# Patient Record
Sex: Female | Born: 1975 | Race: White | Hispanic: No | Marital: Married | Smoking: Current every day smoker
Health system: Southern US, Community
[De-identification: ages and names within clinical notes are randomized; demographics above are authoritative.]

## PROBLEM LIST (undated history)

## (undated) DIAGNOSIS — R569 Unspecified convulsions: Secondary | ICD-10-CM

---

## 2013-06-12 ENCOUNTER — Inpatient Hospital Stay: Payer: Self-pay | Admitting: Internal Medicine

## 2013-06-12 LAB — COMPREHENSIVE METABOLIC PANEL
Albumin: 4 g/dL (ref 3.4–5.0)
Alkaline Phosphatase: 106 U/L (ref 50–136)
BUN: 9 mg/dL (ref 7–18)
Bilirubin,Total: 0.3 mg/dL (ref 0.2–1.0)
Calcium, Total: 8.5 mg/dL (ref 8.5–10.1)
Chloride: 110 mmol/L — ABNORMAL HIGH (ref 98–107)
Creatinine: 0.83 mg/dL (ref 0.60–1.30)
SGOT(AST): 31 U/L (ref 15–37)
Total Protein: 7.5 g/dL (ref 6.4–8.2)

## 2013-06-12 LAB — DRUG SCREEN, URINE
Barbiturates, Ur Screen: NEGATIVE (ref ?–200)
Benzodiazepine, Ur Scrn: POSITIVE (ref ?–200)
MDMA (Ecstasy)Ur Screen: NEGATIVE (ref ?–500)
Opiate, Ur Screen: NEGATIVE (ref ?–300)
Phencyclidine (PCP) Ur S: NEGATIVE (ref ?–25)
Tricyclic, Ur Screen: NEGATIVE (ref ?–1000)

## 2013-06-12 LAB — CBC
HCT: 44.8 % (ref 35.0–47.0)
HGB: 15.1 g/dL (ref 12.0–16.0)
MCHC: 33.7 g/dL (ref 32.0–36.0)
WBC: 39.6 10*3/uL — ABNORMAL HIGH (ref 3.6–11.0)

## 2013-06-12 LAB — URINALYSIS, COMPLETE
Bacteria: NONE SEEN
Bilirubin,UR: NEGATIVE
Glucose,UR: NEGATIVE mg/dL (ref 0–75)
Leukocyte Esterase: NEGATIVE
Nitrite: NEGATIVE
RBC,UR: 6 /HPF (ref 0–5)
Squamous Epithelial: NONE SEEN

## 2013-06-12 LAB — RAPID INFLUENZA A&B ANTIGENS

## 2013-06-12 LAB — ETHANOL: Ethanol: <3 mg/dL

## 2013-06-12 LAB — PROTIME-INR: Prothrombin Time: 13.5 secs (ref 11.5–14.7)

## 2013-06-12 LAB — TROPONIN I: Troponin-I: <0.02 ng/mL

## 2013-06-12 LAB — CK TOTAL AND CKMB (NOT AT ARMC): CK, Total: 473 U/L — ABNORMAL HIGH (ref 21–215)

## 2013-06-12 LAB — CSF CELL COUNT WITH DIFFERENTIAL
CSF Tube #: 3
WBC (CSF): 6 /mm3

## 2013-06-12 LAB — ACETAMINOPHEN LEVEL: Acetaminophen: <2 ug/mL

## 2013-06-12 LAB — GLUCOSE, CSF: Glucose, CSF: 104 mg/dL — ABNORMAL HIGH (ref 40–75)

## 2013-06-12 LAB — PROTEIN, CSF: Protein, CSF: 47 mg/dL — ABNORMAL HIGH (ref 15–45)

## 2013-06-13 LAB — CBC WITH DIFFERENTIAL/PLATELET
Basophil #: 0 10*3/uL (ref 0.0–0.1)
Eosinophil #: 0 10*3/uL (ref 0.0–0.7)
Eosinophil %: 0 %
HCT: 39.6 % (ref 35.0–47.0)
HGB: 13.5 g/dL (ref 12.0–16.0)
Lymphocyte %: 6.5 %
MCH: 31.8 pg (ref 26.0–34.0)
MCHC: 34.2 g/dL (ref 32.0–36.0)
MCV: 93 fL (ref 80–100)
Monocyte #: 0.5 x10 3/mm (ref 0.2–0.9)
Neutrophil #: 19 10*3/uL — ABNORMAL HIGH (ref 1.4–6.5)
Neutrophil %: 91.1 %
Platelet: 267 10*3/uL (ref 150–440)
RBC: 4.25 10*6/uL (ref 3.80–5.20)
RDW: 13.7 % (ref 11.5–14.5)
WBC: 20.8 10*3/uL — ABNORMAL HIGH (ref 3.6–11.0)

## 2013-06-13 LAB — BASIC METABOLIC PANEL
Anion Gap: 7 (ref 7–16)
BUN: 9 mg/dL (ref 7–18)
Calcium, Total: 8.3 mg/dL — ABNORMAL LOW (ref 8.5–10.1)
Chloride: 112 mmol/L — ABNORMAL HIGH (ref 98–107)
Creatinine: 0.7 mg/dL (ref 0.60–1.30)
EGFR (African American): >60
EGFR (Non-African Amer.): >60
Glucose: 137 mg/dL — ABNORMAL HIGH (ref 65–99)
Sodium: 142 mmol/L (ref 136–145)

## 2013-06-13 LAB — MAGNESIUM: Magnesium: 1.8 mg/dL

## 2013-06-13 LAB — TSH: Thyroid Stimulating Horm: 0.55 u[IU]/mL

## 2013-06-13 LAB — PHOSPHORUS: Phosphorus: 3.7 mg/dL (ref 2.5–4.9)

## 2013-06-14 ENCOUNTER — Ambulatory Visit: Payer: Self-pay | Admitting: Neurology

## 2013-06-14 LAB — URINE CULTURE

## 2013-06-14 LAB — WBC: WBC: 19.5 10*3/uL — ABNORMAL HIGH (ref 3.6–11.0)

## 2013-06-14 LAB — CK: CK, Total: 791 U/L — ABNORMAL HIGH (ref 21–215)

## 2013-06-14 LAB — HCG, QUANTITATIVE, PREGNANCY: Beta Hcg, Quant.: <1 m[IU]/mL — ABNORMAL LOW

## 2013-06-15 LAB — COMPREHENSIVE METABOLIC PANEL
Albumin: 2.9 g/dL — ABNORMAL LOW (ref 3.4–5.0)
Alkaline Phosphatase: 56 U/L (ref 50–136)
Bilirubin,Total: 0.4 mg/dL (ref 0.2–1.0)
Calcium, Total: 8 mg/dL — ABNORMAL LOW (ref 8.5–10.1)
Chloride: 112 mmol/L — ABNORMAL HIGH (ref 98–107)
Creatinine: 0.67 mg/dL (ref 0.60–1.30)
Glucose: 97 mg/dL (ref 65–99)
Potassium: 3.2 mmol/L — ABNORMAL LOW (ref 3.5–5.1)
SGOT(AST): 63 U/L — ABNORMAL HIGH (ref 15–37)
SGPT (ALT): 26 U/L (ref 12–78)
Total Protein: 5.5 g/dL — ABNORMAL LOW (ref 6.4–8.2)

## 2013-06-15 LAB — CBC WITH DIFFERENTIAL/PLATELET
Basophil #: 0 10*3/uL (ref 0.0–0.1)
Basophil %: 0.1 %
Eosinophil #: 0 10*3/uL (ref 0.0–0.7)
Eosinophil %: 0.3 %
HCT: 37.1 % (ref 35.0–47.0)
HGB: 12.7 g/dL (ref 12.0–16.0)
Lymphocyte #: 4 10*3/uL — ABNORMAL HIGH (ref 1.0–3.6)
MCH: 31.9 pg (ref 26.0–34.0)
MCV: 93 fL (ref 80–100)
Monocyte #: 1 x10 3/mm — ABNORMAL HIGH (ref 0.2–0.9)
Monocyte %: 7 %
Neutrophil #: 9.4 10*3/uL — ABNORMAL HIGH (ref 1.4–6.5)
Platelet: 217 10*3/uL (ref 150–440)
RBC: 3.98 10*6/uL (ref 3.80–5.20)
RDW: 13.8 % (ref 11.5–14.5)
WBC: 14.4 10*3/uL — ABNORMAL HIGH (ref 3.6–11.0)

## 2013-06-15 LAB — VANCOMYCIN, TROUGH: Vancomycin, Trough: 14 ug/mL (ref 10–20)

## 2013-06-15 LAB — CSF CULTURE W GRAM STAIN

## 2013-06-17 LAB — CULTURE, BLOOD (SINGLE)

## 2014-01-12 ENCOUNTER — Inpatient Hospital Stay: Payer: Self-pay | Admitting: Internal Medicine

## 2014-01-12 LAB — URINALYSIS, COMPLETE
Bacteria: NONE SEEN
Bilirubin,UR: NEGATIVE
Glucose,UR: NEGATIVE mg/dL (ref 0–75)
Granular Cast: 5
KETONE: NEGATIVE
Leukocyte Esterase: NEGATIVE
Nitrite: NEGATIVE
PH: 5 (ref 4.5–8.0)
Protein: 30
SPECIFIC GRAVITY: 1.011 (ref 1.003–1.030)
Squamous Epithelial: 1
WBC UR: NONE SEEN /HPF (ref 0–5)

## 2014-01-12 LAB — COMPREHENSIVE METABOLIC PANEL
ALT: 17 U/L (ref 12–78)
ANION GAP: 26 — AB (ref 7–16)
AST: 18 U/L (ref 15–37)
Albumin: 4.6 g/dL (ref 3.4–5.0)
Alkaline Phosphatase: 99 U/L
BUN: 12 mg/dL (ref 7–18)
Bilirubin,Total: 0.3 mg/dL (ref 0.2–1.0)
CHLORIDE: 103 mmol/L (ref 98–107)
Calcium, Total: 9.8 mg/dL (ref 8.5–10.1)
Co2: 11 mmol/L — ABNORMAL LOW (ref 21–32)
Creatinine: 1.01 mg/dL (ref 0.60–1.30)
EGFR (African American): >60
EGFR (Non-African Amer.): >60
Glucose: 228 mg/dL — ABNORMAL HIGH (ref 65–99)
Osmolality: 286 (ref 275–301)
Potassium: 3.3 mmol/L — ABNORMAL LOW (ref 3.5–5.1)
Sodium: 140 mmol/L (ref 136–145)
Total Protein: 8.5 g/dL — ABNORMAL HIGH (ref 6.4–8.2)

## 2014-01-12 LAB — DRUG SCREEN, URINE
Amphetamines, Ur Screen: NEGATIVE (ref ?–1000)
BARBITURATES, UR SCREEN: NEGATIVE (ref ?–200)
BENZODIAZEPINE, UR SCRN: NEGATIVE (ref ?–200)
COCAINE METABOLITE, UR ~~LOC~~: NEGATIVE (ref ?–300)
Cannabinoid 50 Ng, Ur ~~LOC~~: NEGATIVE (ref ?–50)
MDMA (ECSTASY) UR SCREEN: NEGATIVE (ref ?–500)
Methadone, Ur Screen: NEGATIVE (ref ?–300)
OPIATE, UR SCREEN: NEGATIVE (ref ?–300)
Phencyclidine (PCP) Ur S: NEGATIVE (ref ?–25)
Tricyclic, Ur Screen: NEGATIVE (ref ?–1000)

## 2014-01-12 LAB — MAGNESIUM: MAGNESIUM: 2.5 mg/dL — AB

## 2014-01-12 LAB — CBC
HCT: 51.1 % — AB (ref 35.0–47.0)
HGB: 16.2 g/dL — ABNORMAL HIGH (ref 12.0–16.0)
MCH: 31.2 pg (ref 26.0–34.0)
MCHC: 31.7 g/dL — ABNORMAL LOW (ref 32.0–36.0)
MCV: 99 fL (ref 80–100)
PLATELETS: 355 10*3/uL (ref 150–440)
RBC: 5.18 10*6/uL (ref 3.80–5.20)
RDW: 15 % — ABNORMAL HIGH (ref 11.5–14.5)
WBC: 23.8 10*3/uL — ABNORMAL HIGH (ref 3.6–11.0)

## 2014-01-12 LAB — PREGNANCY, URINE: Pregnancy Test, Urine: NEGATIVE m[IU]/mL

## 2014-01-13 LAB — BASIC METABOLIC PANEL
ANION GAP: 4 — AB (ref 7–16)
BUN: 11 mg/dL (ref 7–18)
Calcium, Total: 8.4 mg/dL — ABNORMAL LOW (ref 8.5–10.1)
Chloride: 114 mmol/L — ABNORMAL HIGH (ref 98–107)
Co2: 23 mmol/L (ref 21–32)
Creatinine: 0.85 mg/dL (ref 0.60–1.30)
EGFR (African American): >60
EGFR (Non-African Amer.): >60
Glucose: 88 mg/dL (ref 65–99)
Osmolality: 280 (ref 275–301)
Potassium: 4.1 mmol/L (ref 3.5–5.1)
Sodium: 141 mmol/L (ref 136–145)

## 2014-01-13 LAB — CBC WITH DIFFERENTIAL/PLATELET
BASOS ABS: 0.1 10*3/uL (ref 0.0–0.1)
BASOS PCT: 0.5 %
Eosinophil #: 0.1 10*3/uL (ref 0.0–0.7)
Eosinophil %: 0.7 %
HCT: 43.6 % (ref 35.0–47.0)
HGB: 14.6 g/dL (ref 12.0–16.0)
LYMPHS PCT: 15.1 %
Lymphocyte #: 2.5 10*3/uL (ref 1.0–3.6)
MCH: 31.8 pg (ref 26.0–34.0)
MCHC: 33.5 g/dL (ref 32.0–36.0)
MCV: 95 fL (ref 80–100)
Monocyte #: 1.4 x10 3/mm — ABNORMAL HIGH (ref 0.2–0.9)
Monocyte %: 8 %
NEUTROS ABS: 12.8 10*3/uL — AB (ref 1.4–6.5)
Neutrophil %: 75.7 %
Platelet: 284 10*3/uL (ref 150–440)
RBC: 4.59 10*6/uL (ref 3.80–5.20)
RDW: 14.5 % (ref 11.5–14.5)
WBC: 16.9 10*3/uL — ABNORMAL HIGH (ref 3.6–11.0)

## 2014-01-13 LAB — MAGNESIUM: Magnesium: 2.1 mg/dL

## 2014-11-05 IMAGING — CT CT HEAD WITHOUT CONTRAST
1 series · 16 of 30 positions shown, 20 images · non-contrast
Comparison: None.

CLINICAL DATA: Seizure activity.

EXAM:
CT HEAD WITHOUT CONTRAST
TECHNIQUE: Contiguous axial images were obtained from the base of the skull
through the vertex without intravenous contrast.

[Series 2: soft tissue · axial · 0.41mm/px · z∈[-250,-116]mm · 16 of 30 slices shown, 20 images]
[im 2/30  brain]
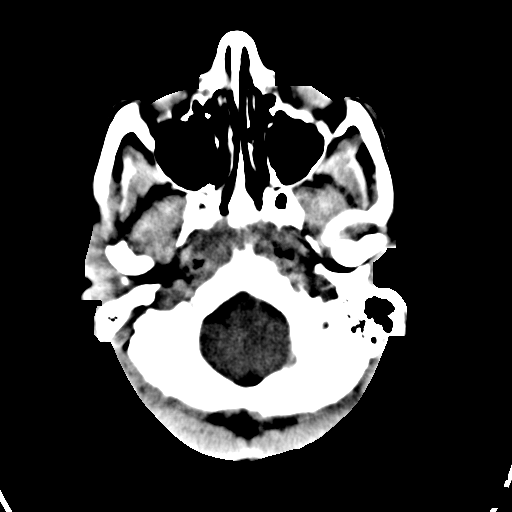
[im 2/30  bone]
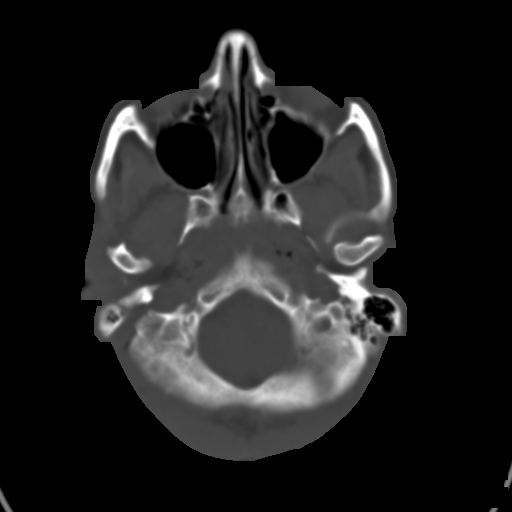
[im 4/30  brain]
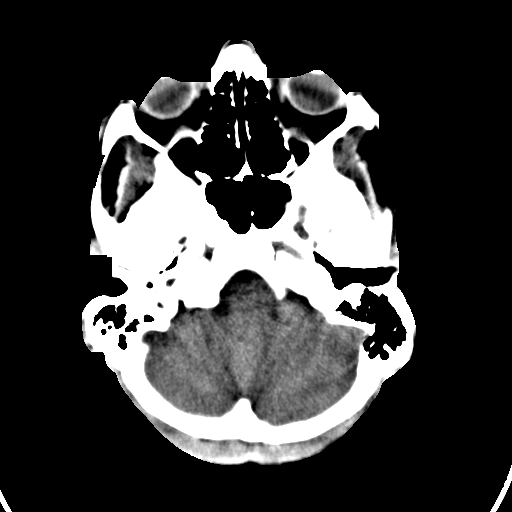
[im 6/30  brain]
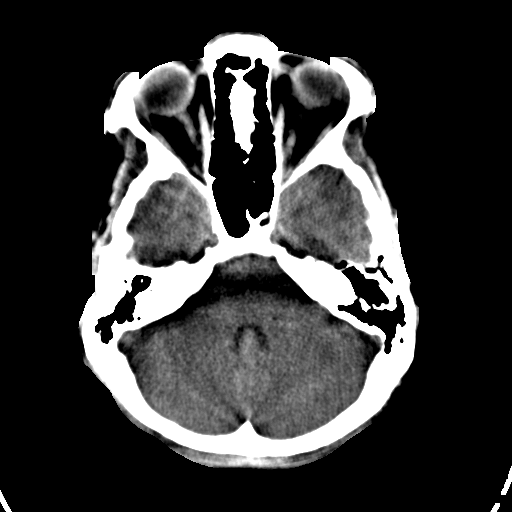
[im 8/30  brain]
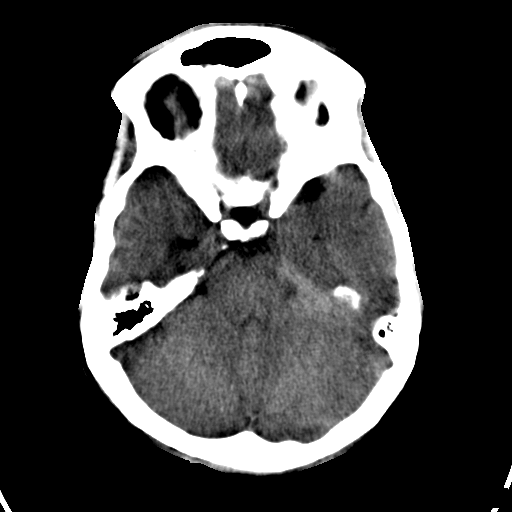
[im 9/30  brain]
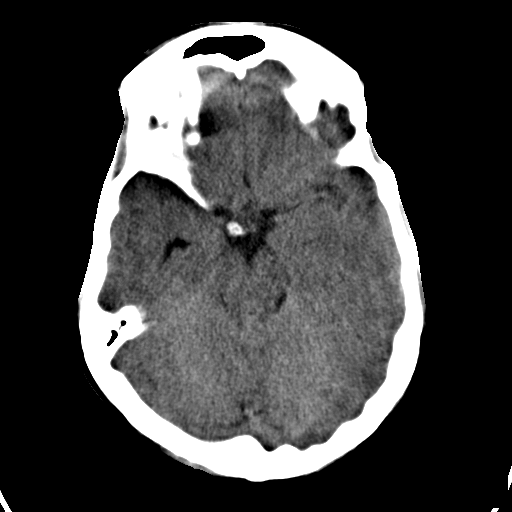
[im 9/30  bone]
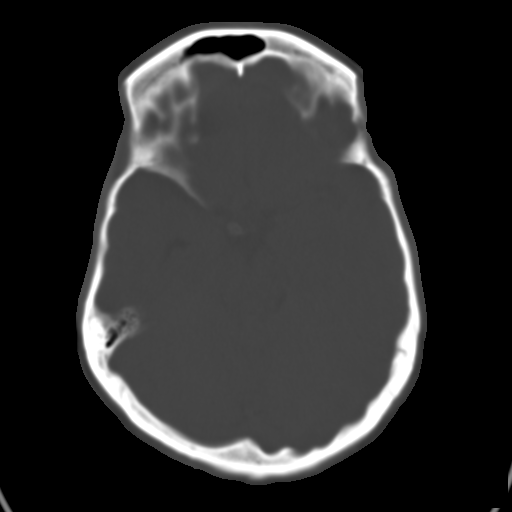
[im 11/30  brain]
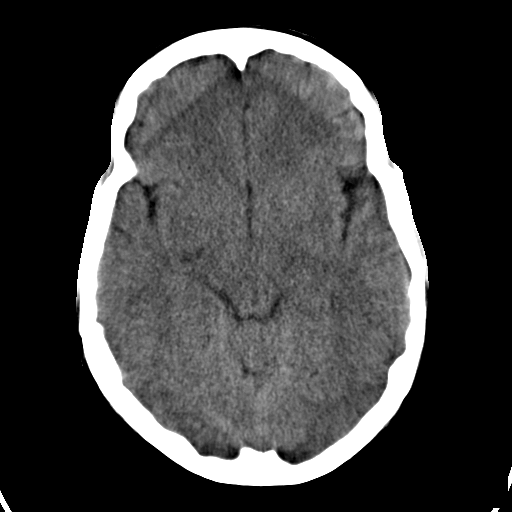
[im 13/30  brain]
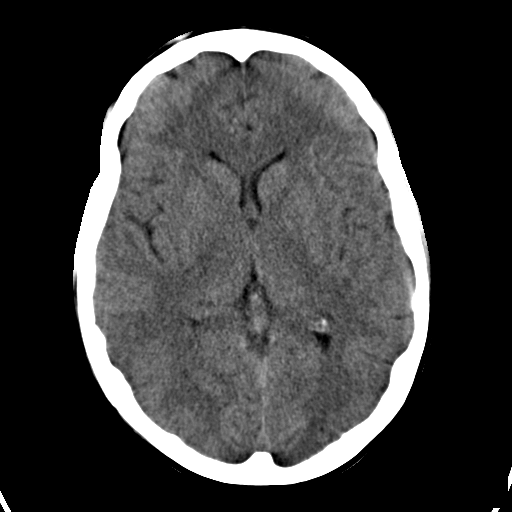
[im 15/30  brain]
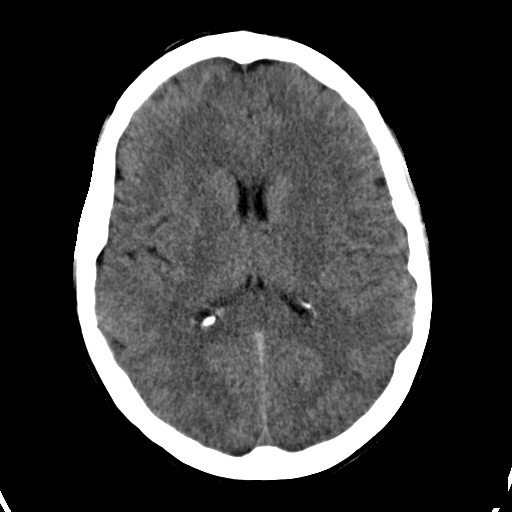
[im 16/30  brain]
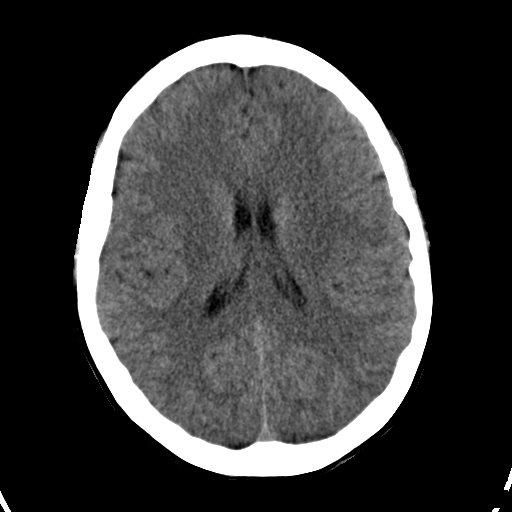
[im 16/30  bone]
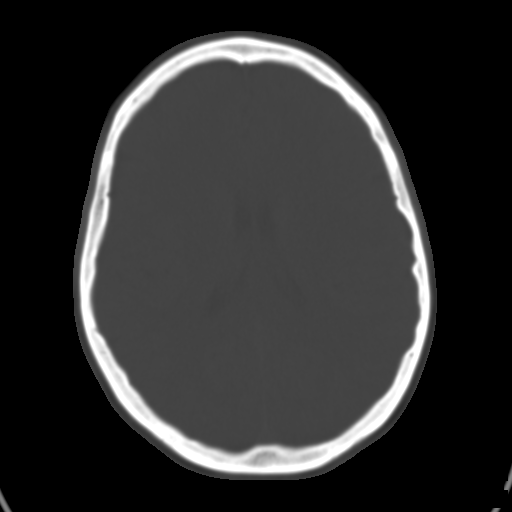
[im 18/30  brain]
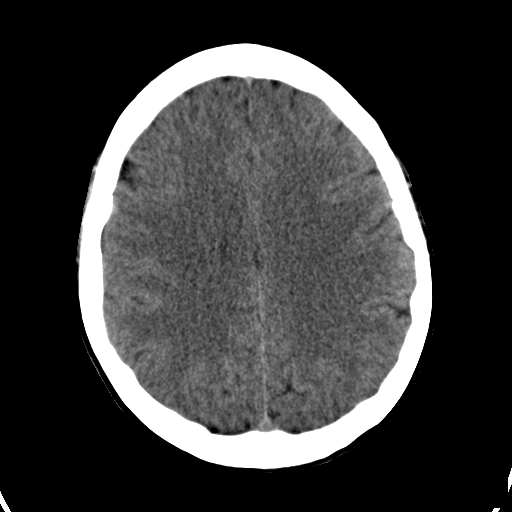
[im 20/30  brain]
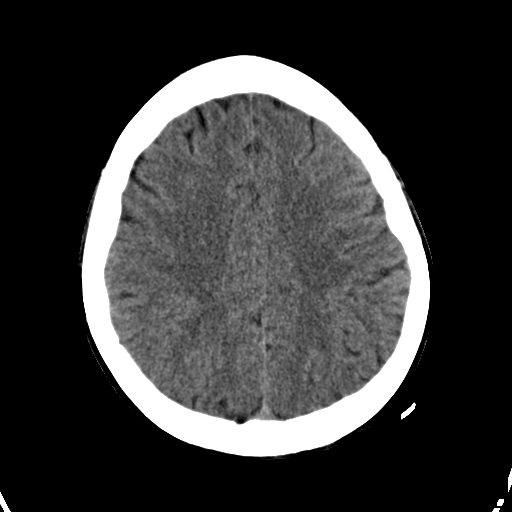
[im 22/30  brain]
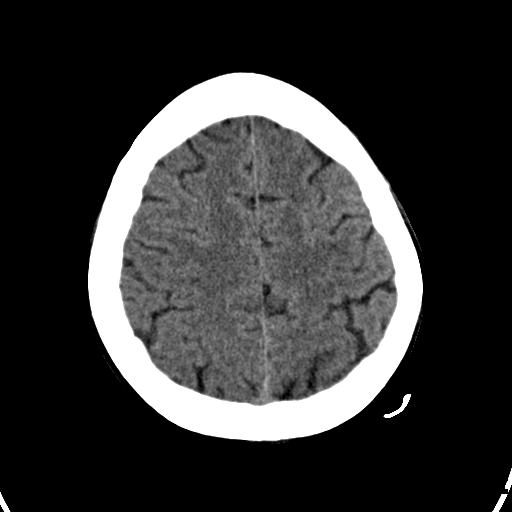
[im 23/30  brain]
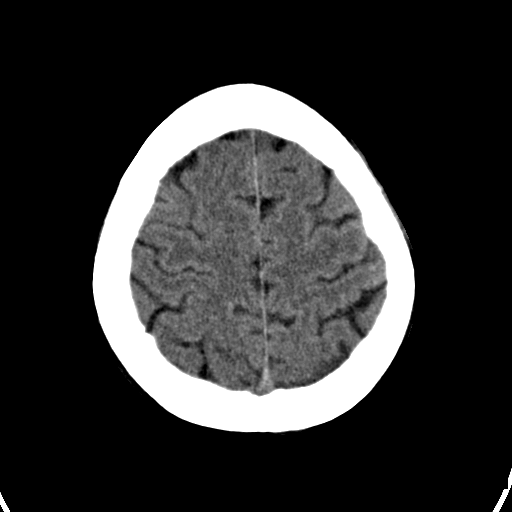
[im 23/30  bone]
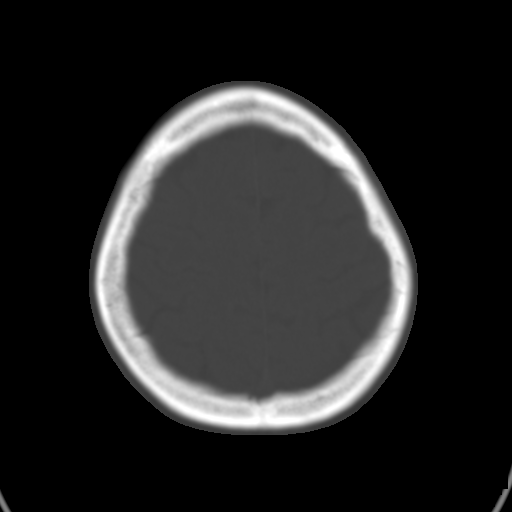
[im 25/30  brain]
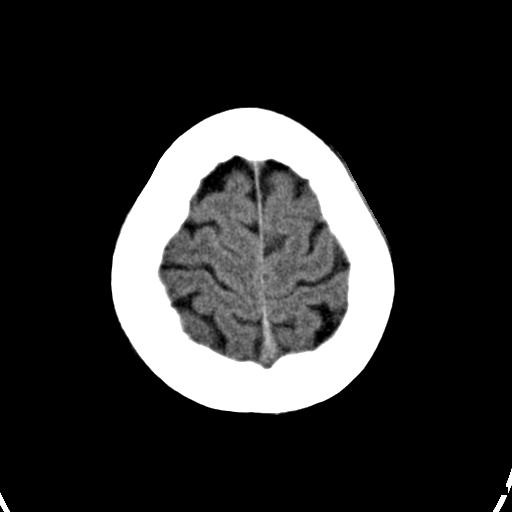
[im 27/30  brain]
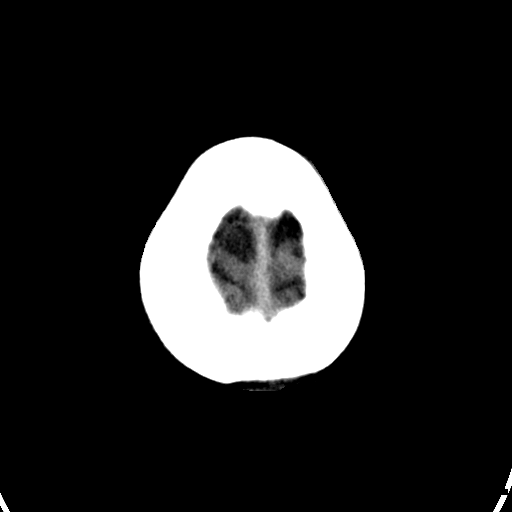
[im 29/30  brain]
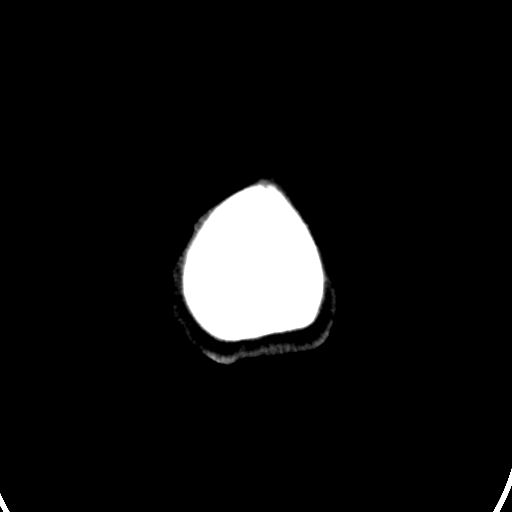

[16 of 30 positions shown; findings below may reference images not displayed]

FINDINGS: The ventricles are normal in size and position. There is no
intracranial hemorrhage nor intracranial mass effect. There are no
abnormal intracranial calcifications. There is no evidence of an
evolving ischemic infarction. The cerebellum and brainstem are
normal in density. At bone window settings the observed portions of
the paranasal sinuses and mastoid air cells are clear.
IMPRESSION: There is no acute intracranial abnormality.

## 2014-11-05 IMAGING — CR DG CHEST 1V PORT
1 series · 1 of 1 positions shown · non-contrast
Comparison: None.

CLINICAL DATA: Intubation.

EXAM:
PORTABLE CHEST - 1 VIEW

[ap]
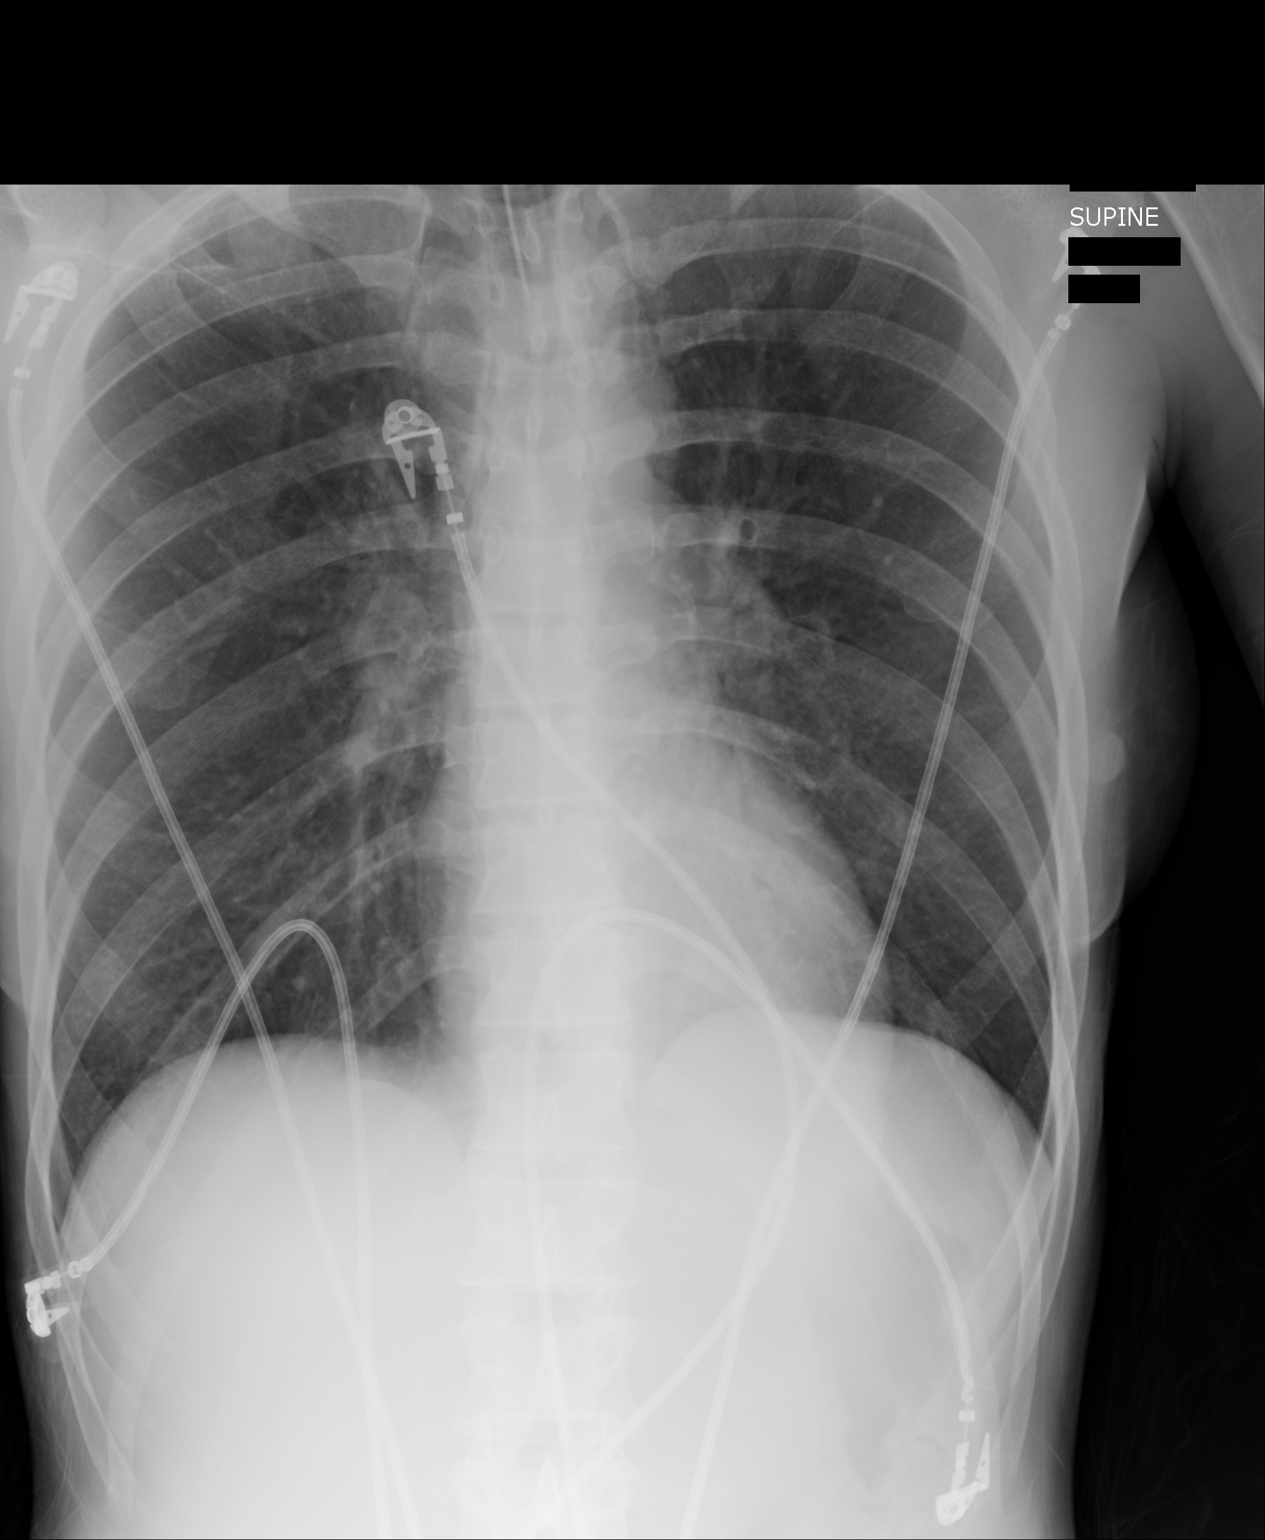

[1 of 1 positions shown; findings below may reference images not displayed]

FINDINGS: Endotracheal tube is 5 cm above the carina. Heart is normal size.
Lungs are clear. No effusions. No acute bony abnormality.
IMPRESSION: Endotracheal tube 5 cm above the carina. No acute cardiopulmonary
disease.

## 2014-11-06 IMAGING — CR DG CHEST 1V PORT
1 series · 2 of 2 positions shown · non-contrast
Comparison: Chest radiograph performed 06/12/2013

CLINICAL DATA: Follow up ventilated patient; respiratory failure.

EXAM:
PORTABLE CHEST - 1 VIEW

[Series 1: ap · 0.17mm/px · 2 of 2 slices shown]
[im 1/2]
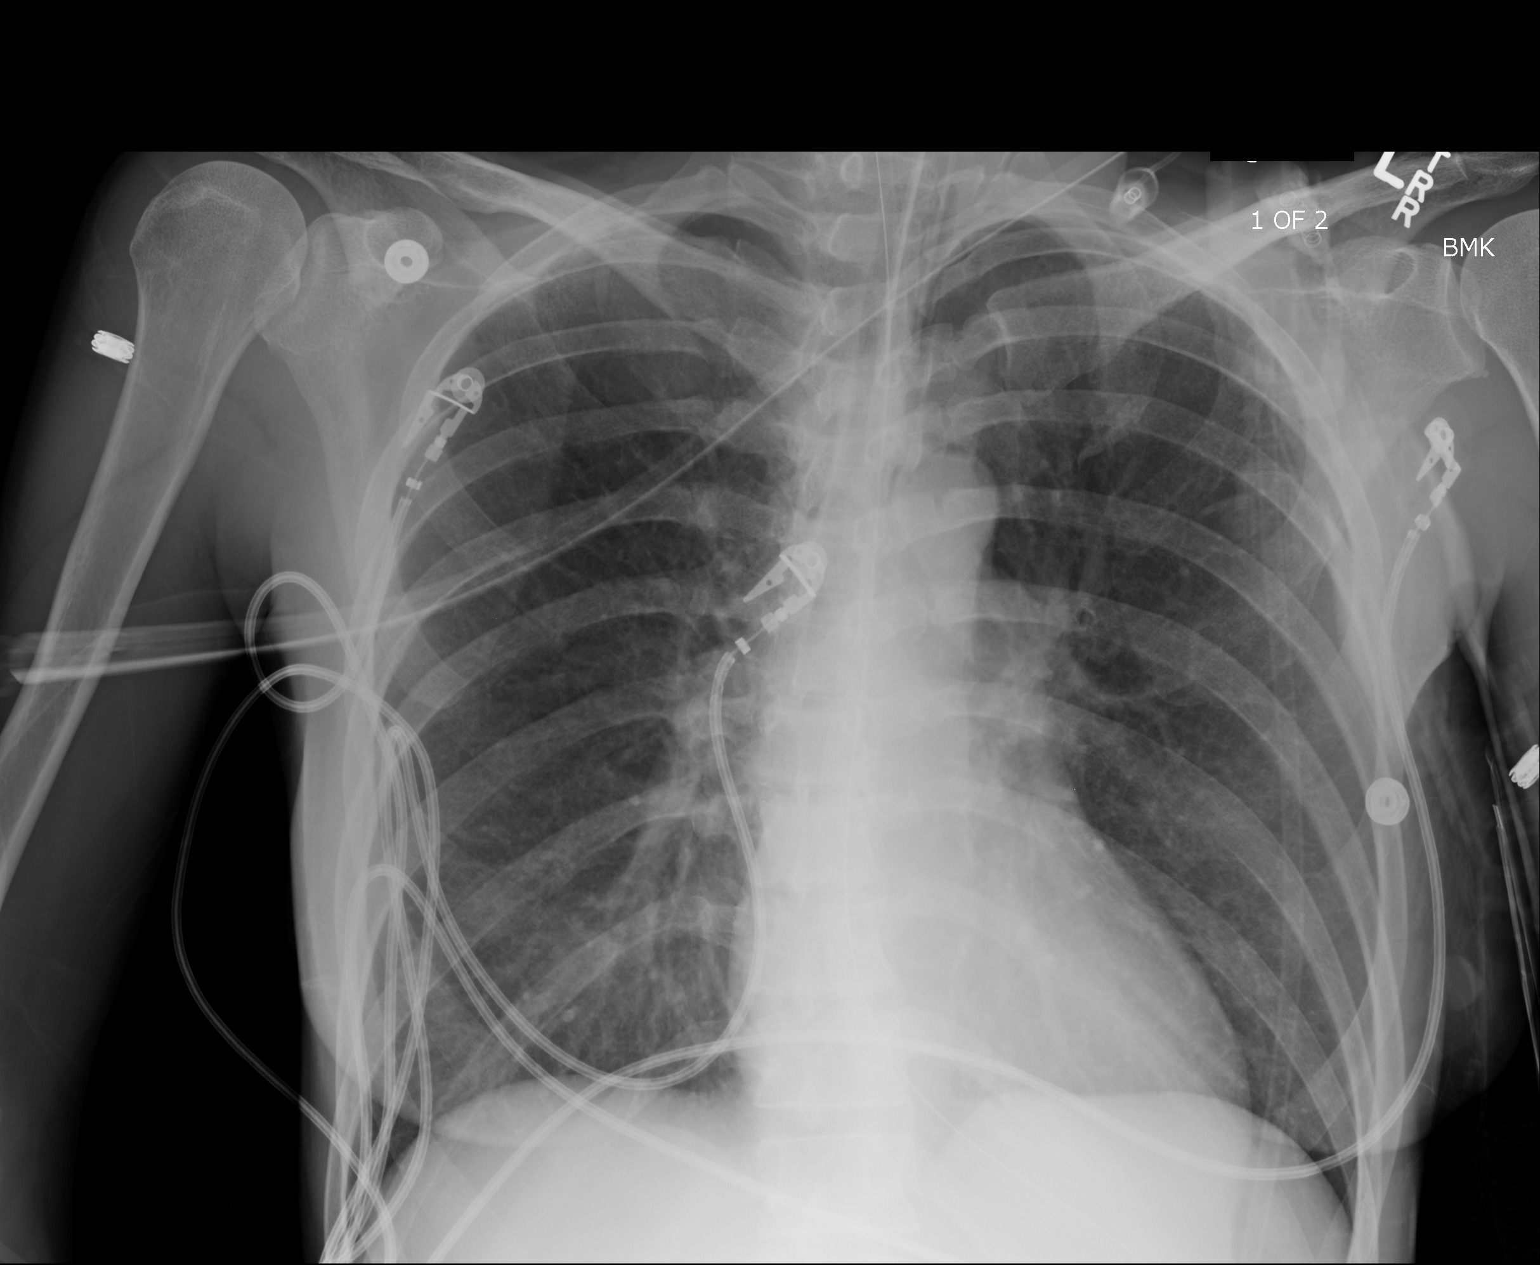
[im 2/2]
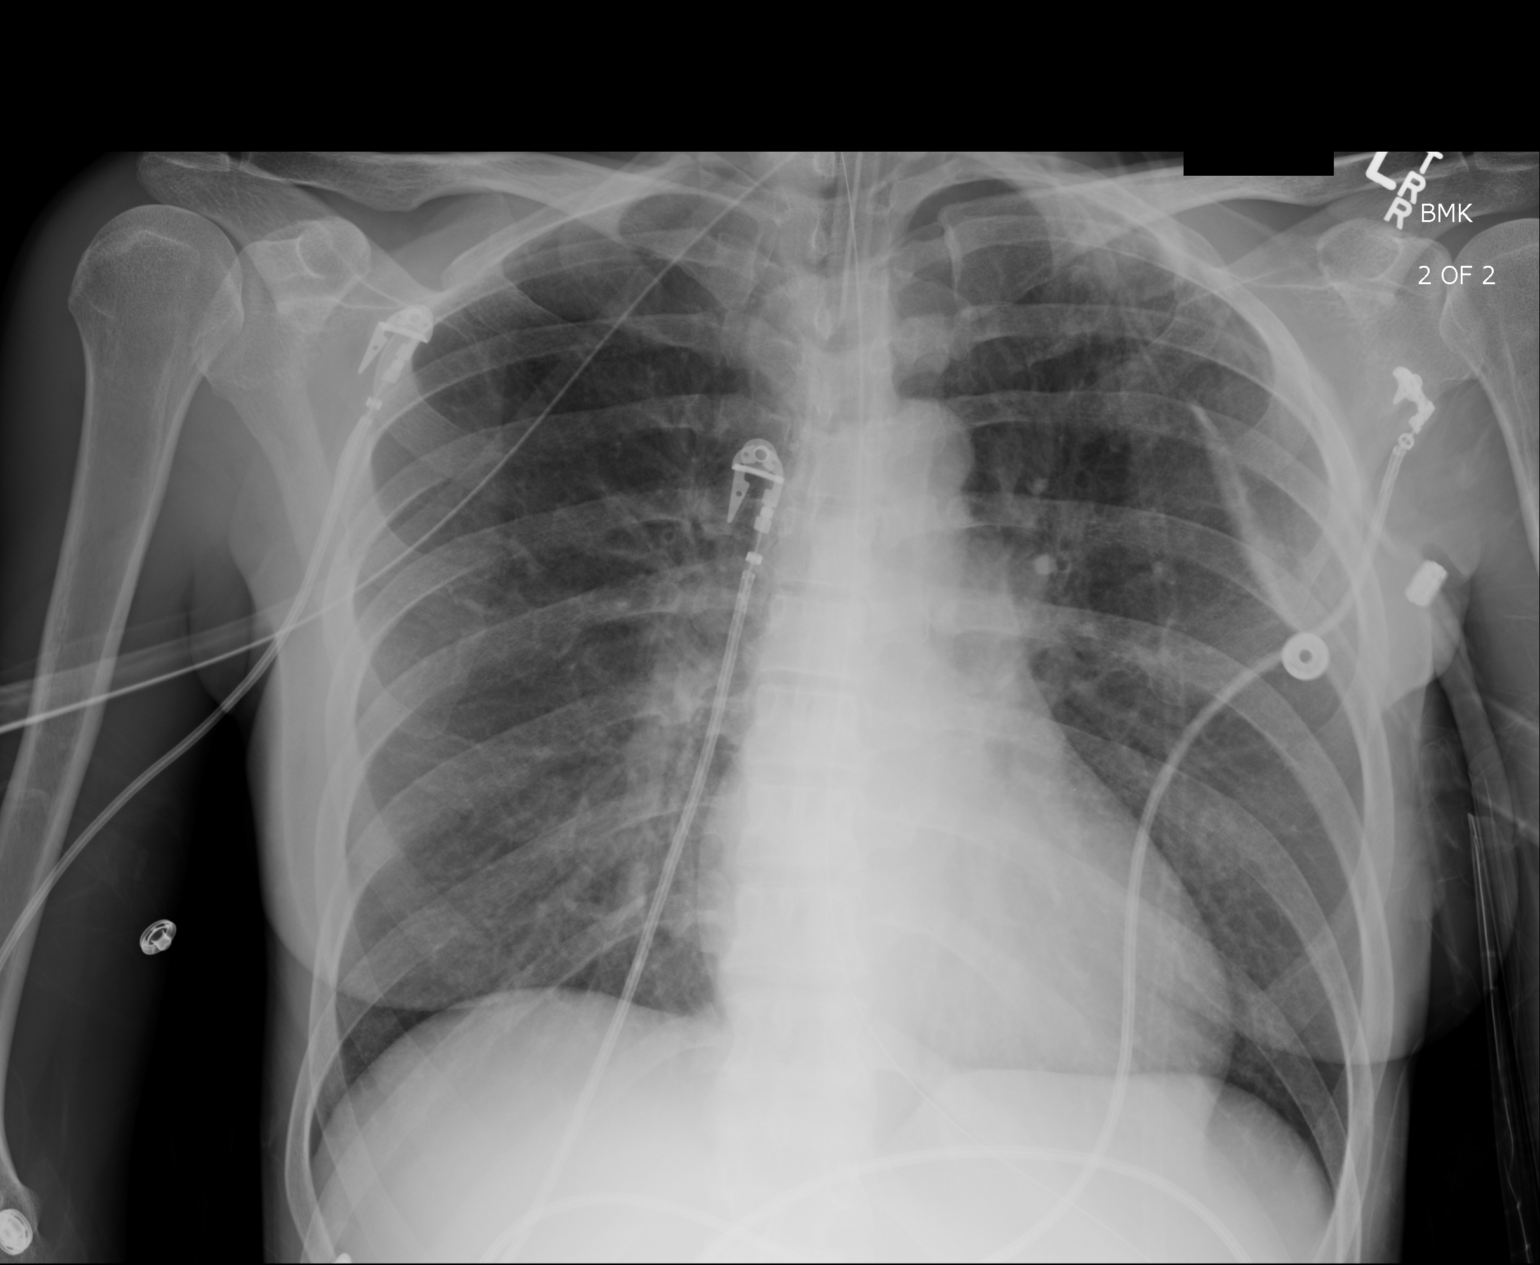

[2 of 2 positions shown; findings below may reference images not displayed]

FINDINGS: The patient's endotracheal tube is seen ending 3 cm above the
carina. The enteric tube is noted extending below the diaphragm.

The lungs are well-aerated and clear. There is no evidence of focal
opacification, pleural effusion or pneumothorax.The
cardiomediastinal silhouette is within normal limits. No acute
osseous abnormalities are seen.
IMPRESSION: 1. Endotracheal tube seen ending 3 cm above the carina.
2. No acute cardiopulmonary process seen.

## 2014-11-20 NOTE — Consult Note (Signed)
Referring Physician:  Alric Seton   Primary Care Physician:  Alric Seton : [facility], [address], [place], [place] [address], [place] [phone]  Reason for Consult: Admit Date: 12-Jun-2013  Chief Complaint: Unresponsive status post seizure  Reason for Consult: seizure   History of Present Illness: History of Present Illness:   39 year old woman with unremarkable past medical history presented with seizures.  Her boyfriend says he got some phone calls that she had not come in to work or called off sick which would be unusual for her because she is normally very prompt and responsible at work.  He went home and saw her making picking movements and very disoriented.  He saw her have some extremity shaking.  He felt it looked like she was having a seizure.  Patient was taken to ED and found to have additional seizures.  She was very confused.  She was found to have some low grade fevers.  The patient occasionally uses marijuana but no hard drug use.  Patient was ultimately started on fosphenytoin and intubated for airway protection.  EEG was performed showing diffuse slowing.  Patient was seen to have fevers of unknown origin thought to have infection, but this has gradually gotten better.  No history of seizures.  No provoking factors identified.  No sleep deprivation.  No change in normal activities.  No family history of seizures, no personal history of trauma or seizures as an infant (febrile seizures).  Given seizures, neurology is asked to consult.   MEDICAL HISTORY: use. SURGICAL HISTORY: HISTORY: smokes about a pack per day. Denies any alcohol or drug use, according to her family.  HISTORY:   Denies.   ROS:  General denies complaints   HEENT no complaints   Lungs no complaints   Cardiac no complaints   GI no complaints   GU no complaints   Musculoskeletal no complaints   Extremities no complaints   Skin no complaints   Endocrine  no complaints   Psych no complaints   Past Medical/Surgical Hx:  Denies:   Home Medications: Medication Instructions Last Modified Date/Time  lacosamide 100 mg oral tablet 1 tab(s) orally 2 times a day 17-Nov-14 17:20   Allergies:  Codeine: Rash, Itching  Vital Signs: **Vital Signs.:   17-Nov-14 13:56  Vital Signs Type Routine  Temperature Temperature (F) 99.5  Celsius 37.5  Temperature Source oral  Pulse Pulse 67  Respirations Respirations 18  Systolic BP Systolic BP 119  Diastolic BP (mmHg) Diastolic BP (mmHg) 75  Mean BP 94  Pulse Ox % Pulse Ox % 96  Pulse Ox Activity Level  At rest  Oxygen Delivery Room Air/ 21 %   EXAM: Patient seems back to normal per her boyfriend.  GENERAL: Pleasant woman.  In NAD.  Normocephalic and atraumatic.  EYES: Funduscopic exam shows normal disc size, appearance and C/D ratio without clear evidence of papilledema.  CARDIOVASCULAR: S1 and S2 sounds are within normal limits, without murmurs, gallops, or rubs.  MUSCULOSKELETAL: Bulk - Normal Tone - Normal Pronator Drift - Absent bilaterally. Ambulation - Gait and station are within normal limits. Romberg - Absent  R/L 5/5    Shoulder abduction (deltoid/supraspinatus, axillary/suprascapular n, C5) 5/5    Elbow flexion (biceps brachii, musculoskeletal n, C5-6) 5/5    Elbow extension (triceps, radial n, C7) 5/5    Finger adduction (interossei, ulnar n, T1)   5/5    Hip flexion (iliopsoas, L1/L2) 5/5  Knee flexion (hamstrings, sciatic n, L5/S1) 5/5    Knee extension (quadriceps, femoral n, L3/4) 5/5    Ankle dorsiflexion (tibialis anterior, deep fibular n, L4/5) 5/5    Ankle plantarflexion (gastroc, tibial n, S1)  NEUROLOGICAL: MENTAL STATUS: Patient is oriented to person, place and time.  Recent and remote memory are intact.  Attention span and concentration are intact.  Naming, repetition, comprehension and expressive speech are within normal limits.  Patient's fund of  knowledge is within normal limits for educational level.  CRANIAL NERVES: Normal    CN II (normal visual acuity and visual fields) Normal    CN III, IV, VI (extraocular muscles are intact) Normal    CN V (facial sensation is intact bilaterally) Normal    CN VII (facial strength is intact bilaterally) Normal    CN VIII (hearing is intact bilaterally) Normal    CN IX/X (palate elevates midline, normal phonation) Normal    CN XI (shoulder shrug strength is normal and symmetric) Normal    CN XII (tongue protrudes midline)   SENSATION: Intact to pain and temp bilaterally (spinothalamic tracts) Intact to position and vibration bilaterally (dorsal columns)   REFLEXES: R/L 2+/2+    Biceps 2+/2+    Brachioradialis   2+/2+    Patellar 2+/2+    Achilles   COORDINATION/CEREBELLAR: Finger to nose testing is within normal limits..  Lab Results:  Thyroid:  14-Nov-14 05:18   Thyroid Stimulating Hormone 0.55 (0.45-4.50 (International Unit)  ----------------------- Pregnant patients have  different reference  ranges for TSH:  - - - - - - - - - -  Pregnant, first trimetser:  0.36 - 2.50 uIU/mL)  Hepatic:  16-Nov-14 04:18   Bilirubin, Total 0.4  Alkaline Phosphatase 56  SGPT (ALT) 26  SGOT (AST)  63  Total Protein, Serum  5.5  Albumin, Serum  2.9  TDMs:  16-Nov-14 21:33   Vancomycin, Trough LAB 14 (Result(s) reported on 15 Jun 2013 at 10:00PM.)  Routine Micro:  13-Nov-14 12:37   Specimen Source INDWELLING CATHETER    15:07   Culture Comment NO GROWTH AEROBICALLY IN 3 DAYS  Gram Stain 1 NO ORGANISMS SEEN NO WBC'S SEEN WHITE BLOOD CELLS  Result(s) reported on 15 Jun 2013 at 09:12AM.    17:18   Micro Text Report INFLUENZA A+B ANTIGENS   COMMENT                   NEGATIVE FOR INFLUENZA A (ANTIGEN ABSENT)   COMMENT                   NEGATIVE FOR INFLUENZA B (ANTIGEN ABSENT)   ANTIBIOTIC                       Lab:  13-Nov-14 12:35   pH (ABG)  7.17  PCO2  51  PO2  102  FiO2  100  Base Excess  -9.9  HCO3  18.3  O2 Saturation 98.0  O2 Device ambu  Specimen Site (ABG) RT RADIAL  Specimen Type (ABG) ARTERIAL  Patient Temp (ABG) 38.2    18:40   pH (ABG)  7.33  PCO2 36  PO2  167  FiO2 40  Base Excess  -6.0  HCO3  18.8  Specimen Site (ABG) RT BRACHIAL  Specimen Type (ABG) ARTERIAL  Patient Temp (ABG) 38.3  Mode ASSIST CONTROL  PEEP 5.0  Lactic Acid, Cardiopulmonary 1.00 (Result(s) reported on 12 Jun 2013 at 06:55PM.)  Vt 500  Mechanical Rate 16 (Result(s) reported on 12 Jun 2013 at 06:55PM.)  14-Nov-14 04:45   pH (ABG) 7.40  PCO2  34  PO2  183  FiO2 40  Base Excess -3.0  HCO3  21.1  O2 Saturation 98.5  O2 Device PB840  Specimen Site (ABG) RT RADIAL  Specimen Type (ABG) ARTERIAL  Patient Temp (ABG) 37.0  Mode ASSIST CONTROL  PEEP 5.0  Lactic Acid, Cardiopulmonary 0.7 (Result(s) reported on 13 Jun 2013 at 04:49AM.)  Vt 500  Mechanical Rate 16 (Result(s) reported on 13 Jun 2013 at 04:49AM.)  15-Nov-14 10:15   pH (ABG)  7.46  PCO2  31  PO2  102  FiO2 30  Base Excess -0.9  HCO3 22.0  O2 Saturation 97.2  O2 Device 840  Specimen Site (ABG) RT RADIAL  Specimen Type (ABG) ARTERIAL  Patient Temp (ABG) 37.0  Mode SPONTANEOUS  PSV 15  PEEP 5.0  %FiO2 30.0 (Result(s) reported on 14 Jun 2013 at 10:29AM.)  Lactic Acid, Cardiopulmonary  1.4 (Result(s) reported on 14 Jun 2013 at 10:29AM.)  General Ref:  13-Nov-14 12:56   HIV Antibodies ========== TEST NAME ==========  ========= RESULTS =========  = REFERENCE RANGE =  HIV 1/2 AB W/CONF WB  Panel [id number] HIV 1/O/2 Abs-Index Value       [   <1.00                ]             <1.00 Index Value: Specimen reactivity relative to the negative cutoff. HIV 1/O/2 Abs, Qual             [   Non Reactive         ]      Non Reactive               [facility]            No: [id number]           [address] [place], [place] [address]           Lindon Romp, MD [phone]   Result(s)  reported on 13 Jun 2013 at 03:34PM.  Acetaminophen, Serum < 2 (10-30 POTENTIALLY TOXIC:  > 200 mcg/mL  > 50 mcg/mL at 12 hr after  ingestion  > 300 mcg/mL at 4 hr after  ingestion)  Salicylates, Serum  5.0 (0.0-2.8 Therapeutic 2.8-20.0 mg/dL Toxic >30.0 mg/dL)    15:07   West Nile Virus, IgM and IgG, CSF ========== TEST NAME ==========  ========= RESULTS =========  = REFERENCE RANGE =  WNV AB, CSF ([facility])  West Nile Virus Antibody, CSF West Nile Virus, IgG            [   Negative             ]          Negative No detectable West Nile Virus IgG Antibody. If a recent infection is suspected, another specimen should be submitted for testing within 7-14 days.               [facility]            No: [id number]           [address], [place], [place] [address]           Josie Dixon, MD         [phone]   Result(s) reported on 16 Jun 2013 at 12:48PM.  VDRL, CSF ==========  TEST NAME ==========  ========= RESULTS =========  = REFERENCE RANGE =  VDRL, CSF  VDRL, CSF VDRL, CSF                       [   Non Reactive         ]      Non Rea:<1:1               [facility]            No: [id number]          [address], [place], [place] [address]           Lindon Romp, MD         [phone]   Result(s) reported on 13 Jun 2013 at 05:54PM.    15:08   HSV 1 and 2 Differentiation by PCR - CSF/Swab/or blood ========== TEST NAME ==========  ========= RESULTS =========  = REFERENCE RANGE =  HSV 1/2 DNA PCR,CSF  HSV 1/2 PCR HSV-1 DNA                       [   Negative             ]          Negative HSV-2 DNA                       [   Negative     ]          Negative This test was developed and its performance characteristics determined by [employer]. It has not been cleared or approved by the U.S. Food and Drug Administration. The FDA has determined that such clearance or approval is not necessary. This test is used for  clinical purposes. It should not be regarded as investigational or research.               [facility]            No: [id number]           [address] [place], [place] [address]   Lindon Romp, MD         [phone]   Result(s) reported on 14 Jun 2013 at 12:48AM.  Cardiology:  13-Nov-14 12:24   Ventricular Rate 147  Atrial Rate 97  P-R Interval 132  QRS Duration 80  QT 316  QTc 494  P Axis 82  R Axis 45  T Axis 63  ECG interpretation Sinus rhythm with frequent Premature ventricular complexes Cannot rule out Anterior infarct , age undetermined Abnormal ECG No previous ECGs available ----------unconfirmed---------- Confirmed by OVERREAD, NOT ([id number]), editor PEARSON, BARBARA ([id number])on 06/13/2013 2:01:58 PM  Routine Chem:  [id number] 12:35   Result Comment - HAND DELIVERED  - NOTIFIED OF CRITICAL VALUE  - to: Sharee Pimple RN 06/12/13 12:48  Result(s) reported on 12 Jun 2013 at 12:50PM.    12:56   Ethanol, S. < 3  Ethanol % (comp) < 0.003 (Result(s) reported on 12 Jun 2013 at 01:37PM.)  14-Nov-14 05:18   Phosphorus, Serum 3.7 (Result(s) reported on 13 Jun 2013 at [facility].)  Magnesium, Serum 1.8 (1.8-2.4 THERAPEUTIC RANGE: 4-7 mg/dL TOXIC: > 10 mg/dL  -----------------------)  15-Nov-14 12:50   HCG Betasubunit Quant. Serum  < 1 (1-3  (International Unit)  ----------------- Non-pregnant <5 Weeks Post LMP mIU/mL  3- 4 wk 9 - 130  4- 5 wk 75 -  2,600  5- 6 wk 850 - 20,800  6- 7 wk 4,000 - 100,000  7-12 wk 11,500 - 289,000 12-16 wk 18,000 - 137,000 16-29 wk 1,400 - 53,000 29-41 wk 940 - 60,000)  16-Nov-14 04:18   Glucose, Serum 97  BUN 16  Creatinine (comp) 0.67  Sodium, Serum 142  Potassium, Serum  3.2  Chloride, Serum  112  CO2, Serum 26  Calcium (Total), Serum  8.0  Osmolality (calc) 284  eGFR (African American) >60  eGFR (Non-African American) >60 (eGFR values <67m/min/1.73 m2 may be an indication of chronic kidney disease (CKD). Calculated eGFR is  useful in patients with stable renal function. The eGFR calculation will not be reliable in acutely ill patients when serum creatinine is changing rapidly. It is not useful in  patients on dialysis. The eGFR calculation may not be applicable to patients at the low and high extremes of body sizes, pregnant women, and vegetarians.)  Anion Gap  4  Urine Drugs:  [id number]  Tricyclic Antidepressant, Ur Qual (comp) NEGATIVE (Result(s) reported on 12 Jun 2013 at 01:49PM.)  Amphetamines, Urine Qual. NEGATIVE  MDMA, Urine Qual. NEGATIVE  Cocaine Metabolite, Urine Qual. NEGATIVE  Opiate, Urine qual NEGATIVE  Phencyclidine, Urine Qual. NEGATIVE  Cannabinoid, Urine Qual. POSITIVE  Barbiturates, Urine Qual. NEGATIVE  Benzodiazepine, Urine Qual. POSITIVE (----------------- The URINE DRUG SCREEN provides only a preliminary, unconfirmed analytical test result and should not be used for non-medical  purposes.  Clinical consideration and professional judgment should be  applied to any positive drug screen result due to possible interfering substances.  A more specific alternate chemical method must be used in order to obtain a confirmed analytical result.  Gas chromatography/mass spectrometry (GC/MS) is the preferred confirmatory method.)  Methadone, Urine Qual. NEGATIVE  Cardiac:  13-Nov-14 12:56   CPK-MB, Serum  6.2 (Result(s) reported on 12 Jun 2013 at 01:37PM.)  Troponin I < 0.02 (0.00-0.05 0.05 ng/mL or less: NEGATIVE  Repeat testing in 3-6 hrs  if clinically indicated. >0.05 ng/mL: POTENTIAL  MYOCARDIAL INJURY. Repeat  testing in 3-6 hrs if  clinically indicated. NOTE: An increase or decrease  of 30% or more on serial  testing suggests a  clinically important change)  15-Nov-14 03:13   CK, Total  791 (Result(s) reported on 14 Jun 2013 at 03:53AM.)  Routine UA:  13-Nov-14 12:37   Color (UA) Straw  Clarity (UA) Turbid  Glucose (UA) Negative  Bilirubin (UA) Negative  Ketones  (UA) Negative  Specific Gravity (UA) 1.014  Blood (UA) 2+  pH (UA) 5.0  Protein (UA) Negative  Nitrite (UA) Negative  Leukocyte Esterase (UA) Negative (Result(s) reported on 12 Jun 2013 at 01:43PM.)  RBC (UA) 6 /HPF  WBC (UA) NONE SEEN  Bacteria (UA) NONE SEEN  Epithelial Cells (UA) NONE SEEN  Mucous (UA) PRESENT  Amorphous Crystal (UA) PRESENT (Result(s) reported on 12 Jun 2013 at 01:43PM.)  CSF Analysis:  13-Nov-14 15:07   CSF Tube # 3  Color - Uncentrifuged (CSF) COLORLESS  Clarity (CSF) CLEAR  Color - Centrifuged (CSF) COLORLESS  RBC  (CSF) 23  WBC  (CSF) 6  Neutrophils  (CSF) 54  Lymphocytes  (CSF) 33  Monocytes/Macrophages  (CSF) 13  Eosinophil  (CSF) 0  Other Cells  (CSF) 0 (Result(s) reported on 12 Jun 2013 at 05:13PM.)  Glucose, CSF  104 (Result(s) reported on 12 Jun 2013 at 04:25PM.)  Protein, CSF  47 (Result(s) reported on 12 Jun 2013 at 04:25PM.)  Routine Coag:  13-Nov-14 12:56   Prothrombin 13.5  INR 1.0 (INR reference interval applies to patients on anticoagulant therapy. A single INR therapeutic range for coumarins is not optimal for all indications; however, the suggested range for most indications is 2.0 - 3.0. Exceptions to the INR Reference Range may include: Prosthetic heart valves, acute myocardial infarction, prevention of myocardial infarction, and combinations of aspirin and anticoagulant. The need for a higher or lower target INR must be assessed individually. Reference: The Pharmacology and Management of the Vitamin K  antagonists: the seventh ACCP Conference on Antithrombotic and Thrombolytic Therapy. AQTMA.2633 Sept:126 (3suppl): N9146842. A HCT value >55% may artifactually increase the PT.  In one study,  the increase was an average of 25%. Reference:  "Effect on Routine and Special Coagulation Testing Values of Citrate Anticoagulant Adjustment in Patients with High HCT Values." American Journal of Clinical Pathology 2006;126:400-405.)   Routine Hem:  16-Nov-14 04:18   WBC (CBC)  14.4  RBC (CBC) 3.98  Hemoglobin (CBC) 12.7  Hematocrit (CBC) 37.1  Platelet Count (CBC) 217  MCV 93  MCH 31.9  MCHC 34.2  RDW 13.8  Neutrophil % 65.0  Lymphocyte % 27.6  Monocyte % 7.0  Eosinophil % 0.3  Basophil % 0.1  Neutrophil #  9.4  Lymphocyte #  4.0  Monocyte #  1.0  Eosinophil # 0.0  Basophil # 0.0 (Result(s) reported on 15 Jun 2013 at 04:30AM.)   Radiology Results: CT:    13-Nov-14 13:57, CT Head Without Contrast  CT Head Without Contrast   REASON FOR EXAM:    seizuing  COMMENTS:       PROCEDURE: CT  - CT HEAD WITHOUT CONTRAST  - Jun 12 2013  1:57PM     CLINICAL DATA:  Seizure activity.    EXAM:  CT HEAD WITHOUT CONTRAST    TECHNIQUE:  Contiguous axial images were obtained from the baseof the skull  through the vertex without intravenous contrast.    COMPARISON:  None.  FINDINGS:  The ventricles are normal in size and position. There is no  intracranial hemorrhage nor intracranial mass effect. There are no  abnormal intracranial calcifications. There is no evidence of an  evolving ischemic infarction. The cerebellum and brainstem are  normal in density. At bone window settings the observed portions of  the paranasal sinuses and mastoid air cells are clear.     IMPRESSION:  There is no acute intracranial abnormality.      Electronically Signed    By: David  Martinique    On: 06/12/2013 13:59     Verified By: DAVID A. Martinique, M.D., MD   Impression/Recommendations: Recommendations:   39 year old woman with unremarkable past medical history presented with seizures.  with unprovoked seizure.  First lifetime seizure, so no indicaiton to start AED since EEG negative for epileptiform activity and Brain MRI is unremarkable.  UTox positive for increased salicylate intake.  Warned patient against this.  Utox positive for MJ and benzos, warned against overuse of benzos and that withdrawal from benzos can sometimes  cause seizures.  CSF studies are unremarkable, viral panels were negative, HIV negative, I do not think that the CSF findings of 6 WBC warrants further workup at this time given her normal exam and normal afebrile temperature.   that over half of unprovoked seizures with negative initial workup never recur.  Discussed risk factors for reducing chances of seizures and steps she can take like avoiding high levels of stress, getting regular sleep (avoiding sleep  deprivation), avoiding any drugs or alcohol, or taking medication that can lower the risk for seizures.  She should always mention to her PCPs her history of seizure before they start her on any medications.  She should followup with me or another outpatient provider in 1-2 weeks to make sure she is doing well.   have personally viewed the patient's Brain MRI and it unremarkable.  I have reviewed the results of the most recent imaging studies, tests and labs as outlined above and answered all related questions.  and coordinated plan of care with hospitalist.   Electronic Signatures: Anabel Bene (MD)  (Signed [phone] 12:22)  Authored: REFERRING PHYSICIAN, Primary Care Physician, Consult, History of Present Illness, Review of Systems, PAST MEDICAL/SURGICAL HISTORY, HOME MEDICATIONS, ALLERGIES, NURSING VITAL SIGNS, Physical Exam-, LAB RESULTS, RADIOLOGY RESULTS, Recommendations   Last Updated: 22-Nov-14 12:22 by Anabel Bene (MD)

## 2014-11-20 NOTE — Consult Note (Signed)
PATIENT NAME:  Valerie Castillo, Valerie Castillo MR#:  [id number] DATE OF BIRTH:  [birth year]  DATE OF CONSULTATION:  06/12/2013  REFERRING PHYSICIAN:  Shreyang H. Allena Katz, MD CONSULTING PHYSICIAN:  Hemang K. Sherryll Burger, MD  REASON FOR CONSULTATION: Encephalitis and status epilepticus.   HISTORY OF PRESENT ILLNESS: The patient is a 39 year old, right-handed Caucasian female who is in usual health until May 11, 2013. Her husband was gone out to [employer], but he had seen her the night before around 10:00, that she was in her usual self.   She might have done marijuana around a week ago or so, but he is not aware of her doing any other drugs.   The patient did not go to her work, so the husband was contacted by the coworkers. He went home, and other people were not getting to the house, as patient was not responding, so he went through the back door.   The patient was found to be staring, unresponsive, picking on things.   Then her husband noticed that she went into a generalized seizure, where the whole body tonic posture, lasting for around 2 minutes or so, with eyes rolled up. He could not tell me whether there was any eye deviation or gaze deviation. She had urinated on herself.   Afterwards, she was sleepy for 2 to 3 minutes and then confused.   The patient was taken to the EMS people after he had called 911, but she had another seizure. The patient might have had another seizure en route, as well as in the ER and the patient was intubated and sedated with propofol, got loaded with fosphenytoin.   The patient was also noted to have a fever and very high WBC count of 39,000, so there was concern for encephalitis or meningitis.   The patient does not have any known rash. No tick bite that husband is aware of.   PAST MEDICAL HISTORY: Essentially negative.   PAST SURGICAL HISTORY: Negative.   ALLERGIES:  SHE DOES NOT HAVE ANY KNOWN DRUG ALLERGIES.   MEDICATIONS: She does not take any medications on a regular  basis.   SOCIAL HISTORY: Significant in that she smokes a pack per day. She does not use drugs or alcohol, per family, but her urine drug screen was positive for marijuana and benzodiazepines.   FAMILY HISTORY: Positive for hypertension.    REVIEW OF SYSTEMS: Unobtainable, as the patient is intubated, sedated.   PHYSICAL EXAMINATION: VITAL SIGNS: Temperature is 101.4, pulse 94, respiratory rate 24, blood pressure 105/55, pulse oximetry is 98%.  GENERAL: She is a young Caucasian female, lying in bed intubated, sedated.   I had talked to the patient's family members outside.   The patient had a nasal swab being taken and she did have a painful squinting of her face and nonpurposeful movements of her arms and legs.   I cannot check her cognitive function. The patient does not seem to have a significantly stiff neck. She has negative Kernig sign and Brudzinski sign.   I did not see any rash on her body.   The nurse might have noticed some rash in the perineal region, which I have not examined.   LUNGS: Clear to auscultation.  HEART: S1, S2 heart sounds. Carotid exam did not reveal any bruit.  ABDOMEN:  Her belly was soft. On her cranial nerves, her pupils were reactive.  NEUROLOGIC:  Extraocular movements were difficult to check, but she did not significant dolls eye movement. Her face was symmetric.  I cannot check her hearing, visual fields, or tongue very well. On her motor exam, she does have changes in the tone. She seemed to be actively resisting some movements. Sometimes she had almost limping of her limbs.   I did not feel that she was having any seizure, as her motor movements were not repetitious, stereotypic.   She does withdraw to painful stimuli in both legs but less responsive with both arms.   LABORATORY, DIAGNOSTIC AND RADIOLOGICAL DATA: Her CT scan of the head was unremarkable.   ASSESSMENT AND PLAN: 1.  Acute onset of frequent seizures, concern for status epilepticus.  Currently, the patient is appropriately intubated and sedated on propofol and has been getting IV fosphenytoin. Agree with getting urgent EEG.  2.  Pharmacy to follow her Dilantin level and keep it high therapeutic range.  3.  If the patient has any breakthrough seizures, we might have to consider adding another antiepileptic medication, such as levetiracetam 1500 mg b.i.d. for two days, then 500 mg p.o. b.i.d.  4.  Concern for encephalitis and meningitis.  5.  I agree with the concern for infectious etiology of her symptoms, due to very high white blood cell count and her being febrile.  6.  CSF results just came back, and she does not have significant elevation of her WBC count or significant elevation of protein, which is very unlikely that she has encephalitis or meningitis.  7.  If okay with primary team, it would be okay for her to get discontinued on her IV antibiotics and antiviral medication. Her droplet precautions can be withdrawn as well.  8.  I had an extensive discussion with her husband, who also noted that the patient's first name is noted spelled incorrectly in the computer, that they will fix.  9.  I will follow this patient with you. Feel free to contact me with any further questions.   The patient is critically ill, and more than 50% of the time was spent in coordination of the care, talking to the family members and nurses. I also talked to Dr. Allena KatzPatel on the phone, Dr. Meredeth IdeFleming in person and with nurses. I spent around 40 minutes for this patient.   ____________________________ Hemang K. Sherryll BurgerShah, MD hks:cg D: 06/12/2013 17:39:01 ET T: 06/12/2013 22:22:55 ET JOB#: [id number]  cc: Hemang K. Sherryll BurgerShah, MD, <Dictator> Durene CalHEMANG K [facility] MD ELECTRONICALLY SIGNED 06/13/2013 12:51

## 2014-11-20 NOTE — Consult Note (Signed)
Brief Consult Note: Diagnosis: status epilepticus, encephalitis/meningitis (?).   Patient was seen by consultant.   Consult note dictated.   Comments: - agree with fosphenytoin load and add levetiracetam if continues to have seizure. agree with urgent EEG (no tech available though) - encephalitis/meningitis (fever, lecocytosis) - CSF came back OK - ok to stop droplet precautions, and stop Abx and antivirals (if OK with hospitalist) - will follow. discussed with family and Dr. Meredeth IdeFleming.  Electronic Signatures: Jolene ProvostShah, Saraiah Bhat Kalpeshkumar (MD)  (Signed [phone]-Nov-14 17:47)  Authored: Brief Consult Note   Last Updated: 13-Nov-14 17:47 by Jolene ProvostShah, Jonet Mathies Kalpeshkumar (MD)

## 2014-11-20 NOTE — H&P (Signed)
PATIENT NAME:  Valerie Castillo, Valerie Castillo MR#:  [id number] DATE OF BIRTH:  Aug 03, 1975  DATE OF ADMISSION:  06/12/2013  PRIMARY CARE PROVIDER: None.   EMERGENCY DEPARTMENT REFERRING PHYSICIAN: Sheryl L. Mindi JunkerGottlieb, MD  CHIEF COMPLAINT: Decrease in responsiveness, status epilepticus, fever.   HISTORY OF PRESENT ILLNESS: The patient is a 39 year old white female with no significant past medical history except smoking, who, according to her boyfriend, was doing okay up until this morning. He went for hunting this morning. At that time, the patient snuggled up to him, but was not fully awake. 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 Therefore, he went home and saw her in her bed, sitting, picking at things, disoriented. Subsequently, she started having tonic-clonic seizure-type activity. Therefore, he called EMS. When EMS got there, the patient was confused, had another seizure activity. They were in the process of bringing her here, and she started becoming conscious and then started becoming agitated and did not want to come here. After being brought to the ED, she continued to have further seizures and received lorazepam. Due to persistent recurrent seizures, she had to be intubated. Her temperature was 100.9, and her WBC count was elevated at 39.6. She currently is on the vent, and I spoke to her significant other. He reports that she had not complained of any symptoms. Only complaint she has had is issue with sinus trouble. She has not had any headaches, has not complained of any neck stiffness and was doing okay. He reports that she does not drink. The only thing she does is smoke. She did use some marijuana last week, but he is not aware of any drug use by the patient.   PAST MEDICAL HISTORY: Essentially is negative.   PAST SURGICAL HISTORY: None.   ALLERGIES: None.   MEDICATIONS: None.   SOCIAL HISTORY: She smokes about a pack per day.  Denies any alcohol or drug use, according to her family.   FAMILY HISTORY: Hypertension.   REVIEW OF SYSTEMS: Unobtainable due to the patient being obtunded and intubated.  PHYSICAL EXAMINATION:  VITAL SIGNS: Temperature 100.9, pulse 101, respirations 20, blood pressure 181/90.  GENERAL: The patient is intubated and currently sedated. Critically ill, thin female, ET tube in place.  HEENT: Head atraumatic, normocephalic. Pupils equally round and reactive to light and accommodation. There is no conjunctival pallor. No scleral icterus. Unable to do extraocular movements. Nasal exam shows no drainage or ulceration externally. External ear exam shows no drainage or erythema. Mouth: She has ET tube in place.  NECK: Supple without any JVD. No thyromegaly.  CARDIOVASCULAR: Regular rate and rhythm. No murmurs, rubs, clicks or gallops. PMI is not displaced.  LUNGS: Clear to auscultation bilaterally without any rales, rhonchi or wheezing.  ABDOMEN: Soft, nondistended. Positive bowel sounds x4. There is no hepatosplenomegaly.  EXTREMITIES: No clubbing, cyanosis or edema.  SKIN: No rash.  LYMPHATICS: No lymph nodes palpable.  VASCULAR: Good DP, PT pulses.  NEUROLOGICAL: The patient is currently intubated. Reflexes 2+. Unable to do any other neuro exam on the patient.  PSYCHIATRIC: Intubated and sedated.   EVALUATION: So far, glucose 126, BUN 9, creatinine 0.83, sodium 139, potassium 4.0, chloride 110, CO2 is 20, her calcium was 8.5. Alcohol level less than 0.03. LFTs were normal. CPK 473, CK-MB 6.2. Troponin less than 0.02. Tricyclics in her blood were negative. WBC 39.6, hemoglobin 15.1, platelet count 344. INR 1. Urinalysis showed  2+ blood. Chest x-ray showed no acute cardiopulmonary processes noted. CT scan of the head is currently pending LP is currently being performed.   ASSESSMENT AND PLAN: The patient is a 39 year old white female, healthy previously, who is brought with seizures as well as  decrease in responsiveness.   1. Status epilepticus, likely due to either meningitis or encephalitis. At this time, will continue her on the ventilator. Continue propofol currently, which she is on. I will continue IV fosphenytoin q.8 hours. I have spoken to Dr. Sherryll Burger of neurology, who will see. Will get an EEG. Will review the CT scan of the head. LP results as well will be reviewed.  2. Sepsis, likely due to meningitis, possible encephalitis. At this time, will treat her with IV vancomycin, ceftriaxone, acyclovir. Will send CSF evaluation for routine as well as HSV, West Nile evaluation, RPR and HIV.  3. Miscellaneous. We will use heparin for deep vein thrombosis prophylaxis post LP.   Please note, the patient's case discussed with her significant other, the ED physician.   TIME SPENT: 65 minutes of critical care time spent on this patient.    ____________________________ Lacie Scotts. Allena Katz, MD shp:lb D: 06/12/2013 14:46:30 ET T: 06/12/2013 15:13:49 ET JOB#: [id number]  cc: Kyrin Garn H. Allena Katz, MD, <Dictator> Charise Carwin MD ELECTRONICALLY SIGNED 06/13/2013 16:56

## 2014-11-20 NOTE — Discharge Summary (Signed)
PATIENT NAME:  Valerie Castillo, BOWER MR#:  [id number] DATE OF BIRTH:  July 09, 1976  DATE OF ADMISSION:  06/12/2013 DATE OF DISCHARGE:  06/16/2013  DISCHARGE DIAGNOSES: 1.  Acute respiratory failure and encephalopathy  due to seizure.  2.  Sepsis ruled out that was questionable on admission, hypotension and fever.  3.  Suspected meningitis or encephalopathy, ruled out by all lab results which came out negative.  4.  Rhabdomyolysis.   CONDITION ON DISCHARGE: Stable.   CODE STATUS: Full code.   MEDICATIONS ON DISCHARGE:  Lacosamide 100 mg oral tablet 2 times a day.   ACTIVITY: As tolerated.   FOLLOWUP:  Advised to follow in neurology clinic in 1 to 2 weeks. Advised not to have driving or no heavy machinery operating for 6 months.   HISTORY OF PRESENT ILLNESS: The patient is a 39 year old female with no significant past medical history except smoking, was found to be disoriented by boyfriend and subsequently started having tonic-clonic seizure-type activity and so he called EMS. The patient was confused and had another seizure-like activity while she was in the process to bring to the Emergency Room and she became unconscious and was very agitated, did not want to come to the hospital. Continued having further seizure in ER also so she received Ativan. Due to persistent and recurrent seizures she was intubated. Temperature was 100.9 and white cell count was 39,000 on admission so she was admitted to Critical Care Unit with ventilator management. Neurologic consult was called in. Her lumbar puncture was done because of possibility of meningitis or encephalitis due to fever and elevated white cell count but which was reported grossly negative, only 6 WBCs. All the cultures, HSV studies, HIV studies and bacterial Gram stains were negative on CSF results. Initially, she was started on broad-spectrum antibiotic and antiviral medication to cover for meningitis but they all eventually reported negative so I  discontinued all the antibiotics after 2 to 3 days and the patient remained stable without any fever. After extubation, she was totally alert and oriented and was feeling comfortable. MRI of the brain was done which was without any significant finding. EEG was done which did not show any seizure-like activity. She was started on lacosamide by neurologist and she remained seizure-free and stable so we are discharging for further management as per neurologist in the office.   OTHER MEDICAL ISSUES IN THIS HOSPITAL COURSE:  1.  Sepsis. It was suspected on admission due to elevated white cell count and fever and altered mental status, but eventually  all blood cultures and CSF results remained negative so we discontinued antibiotics. The patient remained stable so sepsis was ruled out and possibly all the presentation was due to seizure.  2.  Rhabdomyolysis. This was present on admission due to seizure but it improved with IV fluid.   IMPORTANT LABORATORY AND RADIOLOGICAL RESULTS IN THE HOSPITAL: Blood culture was negative on admission. ABG: PH was 7.17, pCO2 was 51 and pO2 was 102 on admission. Urinalysis color was turbid with positive cocaine in the urine. HIV was negative. West Nile virus IgM CSF was negative. VDRL CSF was negative. CSF showed 6 WBCs and 23 RBCs. CSF culture was no growth. HSV-1 and 2 PCR CSF was negative. Influenza A and B antigens were negative. Her white cell count gradually came down to 14,000 on 11/16. MRI brain with and without contrast showed normal MRI of brain.   CONSULT IN THE HOSPITAL: Neurologic consult with Dr. Sherryll Burger and Dr. Theora Master.  TOTAL  TIME SPENT ON THIS DISCHARGE: 40 minutes.   ____________________________ Hope PigeonVaibhavkumar G. Elisabeth PigeonVachhani, MD vgv:cs D: 06/16/2013 16:55:24 ET T: 06/16/2013 19:05:09 ET JOB#: [id number]  cc: Hope PigeonVaibhavkumar G. Elisabeth PigeonVachhani, MD, <Dictator> Paulita FujitaZachary E. Malvin JohnsPotter, MD Altamese DillingVAIBHAVKUMAR Bradyn Vassey MD ELECTRONICALLY SIGNED 06/30/2013 10:11

## 2014-11-21 NOTE — Discharge Summary (Signed)
PATIENT NAME:  Valerie Castillo, SCHRODT MR#:  [id number] DATE OF BIRTH:  Dec 21, 1975  DATE OF ADMISSION:  01/12/2014 DATE OF DISCHARGE:  01/13/2014  ADMITTING DIAGNOSIS: Seizures.   DISCHARGE DIAGNOSES: 1.  Recurrent seizures due to medical noncompliance.  2.  Altered mental status due to metabolic encephalopathy due to seizures, resolved. 3.  Hyperglycemia due to stress, resolved.  4.  Leukocytosis.  5.  Dehydration.  6.  Hypokalemia.  7.  History of seizure disorder.  8.  Tobacco abuse.   DISCHARGE CONDITION: Stable.   DISCHARGE MEDICATIONS: The patient is to start Keppra 1 gram twice daily for 3 days, then 500 mg twice daily.   HOME OXYGEN: None.   DIET: Regular, regular consistency.   ACTIVITY LIMITATIONS: As tolerated.    FOLLOWUP APPOINTMENT: With Dr. Sherryll Burger, neurology, in 2 days after discharge.   CONSULTANTS: Care management, social work.   RADIOLOGIC STUDIES: Chest x-ray, portable, single view on 01/12/2014 revealed no active disease.   HISTORY:  This is a 39 year old Caucasian female with history of seizure disorder, who was discharged in November 2015 on Vimpat and most recently was on Keppra, presents to the hospital with seizure. Please refer to Dr. Prudencio Pair admission note on 01/12/2014. Apparently, the patient had 3 seizures at home and she was brought to the Emergency Room for further evaluation. In the Emergency Room, she was somewhat confused, however, admitted not taking her medications according to recommendations. She also admitted to smoking. Her physical exam was remarkable for lethargy and somewhat confusion. The patient's lab data done in the Emergency Room revealed glucose level of 228, potassium of 3.3, otherwise BMP was unremarkable, CO2 was low at 11. Patient's magnesium level was elevated at 2.5. Liver enzymes revealed albumin level of 4.6 and total protein of 8.5. Urine drug screen was negative. The patient's CBC revealed a white blood cell count of 23.8, hemoglobin  was 16.2, platelet count 355. Urinalysis was unremarkable. Keppra level taken on 01/12/2014 was not detected. Pregnancy test in urine was negative. EKG showed sinus tach at 104 beats per minute, possible left atrial enlargement, possible anterior infarct which was already cited in November 2014 and no acute ST-T changes were noted.   The patient was admitted to the hospital for further evaluation. She was loaded with Keppra IV and was re-evaluated on 01/13/2014. She had a prolonged discussion with attending physician about need of use of medication and she was agreeable to continue to use them. She was advised to start Keppra at a high dose, loading dose at 1 gram twice daily dose for 3 days and then decrease the dose to 500 mg twice daily dose. She was also recommended to follow up with Dr. Clelia Croft, neurologist, in the next few days after discharge. I discussed the patient's case with Dr. Sherryll Burger and, although it initially was thought the patient was continued on Vimpat, she was most recently apparently on Keppra. She is to start loading dose and to resume Keppra doses at her usual doses after loading dose.   In regards to hypokalemia, the patient was given potassium supplements and her hypokalemia resolved. For leukocytosis, the patient's white blood cell count was rechecked on the day of discharge and it was improving. It is recommended to follow the patient's white blood cell count as outpatient.  In regards to tobacco abuse, this was discussed with patient for a long period of time. Nicotine replacement was recommended, however, the patient did not accept that.  She was not willing to discuss her  tobacco abuse issues although the patient's family, including her mother and father, were very much for discontinuation of tobacco abuse. In regards to hypertension, the patient's glucose level was rechecked on the day of discharge which was done fasting and was found to be normal at 88. In regards to metabolic  encephalopathy, it was postictal issues and the patient's altered mental status resolved as time progressed.   On the day of discharge, January 13, 2014, the patient felt satisfactory, did not complain of any significant discomfort. She was afebrile with temperature of 98.4, pulse was 65 to 75, respiration rate was 20, blood pressure 119/77. Saturation was 98% on room air at rest. The patient is being discharged in stable condition with the above-mentioned medications and followup with Dr. Sherryll BurgerShah in the next few days after discharge.   TIME SPENT: 40 minutes.   ____________________________ Katharina Caperima Vaickute, MD rv:cs D: 01/13/2014 18:30:00 ET T: 01/13/2014 19:05:17 ET JOB#: [id number]  cc: Dr. Serita ButcherShah Rima Vaickute, MD, <Dictator>   RIMA VAICKUTE MD ELECTRONICALLY SIGNED 01/24/2014 18:18

## 2014-11-21 NOTE — H&P (Signed)
PATIENT NAME:  Valerie Castillo, Valerie Castillo MR#:  [id number] DATE OF BIRTH:  [birth year]  DATE OF ADMISSION:  01/12/2014  ADMITTING PHYSICIAN: Enid Baas, MD   PRIMARY CARE PHYSICIAN: Not known at this time.    CHIEF COMPLAINT: Seizures.   HISTORY OF PRESENT ILLNESS: Valerie Castillo is a 39 year old young Caucasian female with past medical history significant for seizure disorder, who was last admitted here in November 2014 for status epilepticus and was discharged on lacosamide twice a day. Today, she was brought in by EMS after she had 3 seizures at home. No family is available at this time to give more history, and the patient is postictal and confused at this time. Apparently she has been on Keppra recently, not sure when that was changed. However, when patient awoke, she said she was taking Keppra once a day and probably might have missed a few doses, however, still not quite convincing because she is confused at this time. She had another seizure here in the hospital, received some Ativan, loaded with Keppra and is stable at this time.   PAST MEDICAL HISTORY: Seizure disorder.   PAST SURGICAL HISTORY: None.  ALLERGIES TO MEDICATIONS: No known drug allergies.   CURRENT HOME MEDICATIONS: Keppra.   SOCIAL HISTORY: Smokes about a pack per day. Lives at home with her husband. Last time, her drug screen was positive for marijuana and benzos. This time, the drug screen is pending at this time.  FAMILY HISTORY: Hypertension in the family.   REVIEW OF SYSTEMS: Difficult to be obtained, as patient quite lethargic and confused.   PHYSICAL EXAMINATION: VITAL SIGNS: Temperature 98.6 degrees Fahrenheit, pulse 93, respirations 22, blood pressure 144/118, pulse oximetry 99% on room air.  GENERAL: Well-built, well-nourished female lying in bed, not in any acute distress.  HEENT: Normocephalic, atraumatic. Pupils equal, round, reacting to light. Anicteric sclerae. Extraocular movements intact. Oropharynx clear  without erythema, mass or exudates.  NECK: Supple. No thyromegaly, JVD or carotid bruits. No lymphadenopathy.  LUNGS: Moving air bilaterally. Clear lung fields. No wheeze or crackles. No use of accessory muscles for breathing.  CARDIOVASCULAR: S1, S2, regular rate and rhythm. No murmurs, rubs or gallops.  ABDOMEN: Soft, nontender, nondistended. No hepatosplenomegaly. Normal bowel sounds.  EXTREMITIES: No pedal edema, no clubbing or cyanosis, 2+ dorsalis pedis pulses palpable bilaterally.  SKIN: No acne, rash or lesions.  LYMPHATICS: No cervical lymphadenopathy. NEUROLOGIC: The patient is lethargic, arousable, confused, but following commands. Able to move all 4 extremities.  PSYCHOLOGICAL: The patient is lethargic.   LABORATORY DATA: WBC 23.8, hemoglobin 16.3, hematocrit 51.1, platelet count 355.   Sodium 140, potassium 3.3, chloride 102, bicarbonate 11, BUN 12, creatinine 1.01, glucose 228, and calcium of 9.8.   ALT 17, AST 18, alkaline phosphatase 99, total bilirubin 0.3, albumin of 4.6.   Urinalysis negative for any infection. Urine tox screen pending.   RADIOLOGICAL DATA: Chest x-ray showing clear lung fields. No acute cardiopulmonary disease.   ASSESSMENT AND PLAN: This is a 39 year old female with history of seizure disorder, on Keppra, not sure if she is compliant with medications, brought in for status epilepticus. 1.  Status epilepticus: Recent admission in November 2014, discharged on lacosamide but it seems like she is on Keppra currently. Newer EEG done last admission was negative at the time. Probably noncompliance with medications. Neurology consult. Ativan p.r.n., loaded with Keppra, and Keppra. IV b.i.d. at this time.  2.  Leukocytosis and hemoconcentration: IV fluids. Likely no source of infection. Likely from stress reaction and  from status epilepticus. Monitor at this time.   TIME SPENT ON ADMISSION: 50 minutes.    ____________________________ Enid Baasadhika Meesha Sek,  MD rk:jcm D: 01/12/2014 20:53:05 ET T: 01/12/2014 21:52:52 ET JOB#: [id number]  cc: Enid Baasadhika Sonnet Rizor, MD, <Dictator> Enid BaasADHIKA Abdimalik Mayorquin MD ELECTRONICALLY SIGNED 01/19/2014 15:57

## 2015-10-14 ENCOUNTER — Emergency Department: Payer: Self-pay

## 2015-10-14 ENCOUNTER — Encounter: Payer: Self-pay | Admitting: Emergency Medicine

## 2015-10-14 ENCOUNTER — Inpatient Hospital Stay
Admission: EM | Admit: 2015-10-14 | Discharge: 2015-10-16 | DRG: 101 | Disposition: A | Discharge status: Home / Self Care | Payer: Self-pay | Attending: Internal Medicine | Admitting: Internal Medicine

## 2015-10-14 DIAGNOSIS — F329 Major depressive disorder, single episode, unspecified: Secondary | ICD-10-CM | POA: Diagnosis present

## 2015-10-14 DIAGNOSIS — D72829 Elevated white blood cell count, unspecified: Secondary | ICD-10-CM | POA: Diagnosis present

## 2015-10-14 DIAGNOSIS — G40909 Epilepsy, unspecified, not intractable, without status epilepticus: Principal | ICD-10-CM | POA: Diagnosis present

## 2015-10-14 DIAGNOSIS — Z9114 Patient's other noncompliance with medication regimen: Secondary | ICD-10-CM

## 2015-10-14 DIAGNOSIS — F17210 Nicotine dependence, cigarettes, uncomplicated: Secondary | ICD-10-CM | POA: Diagnosis present

## 2015-10-14 DIAGNOSIS — F1310 Sedative, hypnotic or anxiolytic abuse, uncomplicated: Secondary | ICD-10-CM | POA: Diagnosis present

## 2015-10-14 DIAGNOSIS — R569 Unspecified convulsions: Secondary | ICD-10-CM

## 2015-10-14 DIAGNOSIS — F1410 Cocaine abuse, uncomplicated: Secondary | ICD-10-CM | POA: Diagnosis present

## 2015-10-14 HISTORY — DX: Unspecified convulsions: R56.9

## 2015-10-14 LAB — URINE DRUG SCREEN, QUALITATIVE (ARMC ONLY)
Amphetamines, Ur Screen: NOT DETECTED
BENZODIAZEPINE, UR SCRN: POSITIVE — AB
Barbiturates, Ur Screen: NOT DETECTED
CANNABINOID 50 NG, UR ~~LOC~~: NOT DETECTED
Cocaine Metabolite,Ur ~~LOC~~: POSITIVE — AB
MDMA (ECSTASY) UR SCREEN: NOT DETECTED
Methadone Scn, Ur: NOT DETECTED
Opiate, Ur Screen: NOT DETECTED
Phencyclidine (PCP) Ur S: NOT DETECTED
TRICYCLIC, UR SCREEN: POSITIVE — AB

## 2015-10-14 LAB — GLUCOSE, CAPILLARY: GLUCOSE-CAPILLARY: 154 mg/dL — AB (ref 65–99)

## 2015-10-14 LAB — URINALYSIS COMPLETE WITH MICROSCOPIC (ARMC ONLY)
BACTERIA UA: NONE SEEN
Bilirubin Urine: NEGATIVE
GLUCOSE, UA: NEGATIVE mg/dL
LEUKOCYTES UA: NEGATIVE
Nitrite: NEGATIVE
PROTEIN: 30 mg/dL — AB
Specific Gravity, Urine: 1.016 (ref 1.005–1.030)
pH: 6 (ref 5.0–8.0)

## 2015-10-14 LAB — BASIC METABOLIC PANEL WITH GFR
Anion gap: 10 (ref 5–15)
BUN: 10 mg/dL (ref 6–20)
CO2: 22 mmol/L (ref 22–32)
Calcium: 8.6 mg/dL — ABNORMAL LOW (ref 8.9–10.3)
Chloride: 104 mmol/L (ref 101–111)
Creatinine, Ser: 0.81 mg/dL (ref 0.44–1.00)
GFR calc Af Amer: >60 mL/min
GFR calc non Af Amer: >60 mL/min
Glucose, Bld: 90 mg/dL (ref 65–99)
Potassium: 3.6 mmol/L (ref 3.5–5.1)
Sodium: 136 mmol/L (ref 135–145)

## 2015-10-14 LAB — CBC WITH DIFFERENTIAL/PLATELET
Basophils Absolute: 0.1 10*3/uL (ref 0–0.1)
Basophils Relative: 0 %
Eosinophils Absolute: 0 10*3/uL (ref 0–0.7)
Eosinophils Relative: 0 %
HCT: 45.4 % (ref 35.0–47.0)
Hemoglobin: 14.9 g/dL (ref 12.0–16.0)
Lymphocytes Relative: 5 %
Lymphs Abs: 1.2 10*3/uL (ref 1.0–3.6)
MCH: 30.1 pg (ref 26.0–34.0)
MCHC: 32.8 g/dL (ref 32.0–36.0)
MCV: 92 fL (ref 80.0–100.0)
Monocytes Absolute: 1.1 10*3/uL — ABNORMAL HIGH (ref 0.2–0.9)
Monocytes Relative: 4 %
Neutro Abs: 22.8 10*3/uL — ABNORMAL HIGH (ref 1.4–6.5)
Neutrophils Relative %: 91 %
Platelets: 331 10*3/uL (ref 150–440)
RBC: 4.94 MIL/uL (ref 3.80–5.20)
RDW: 13.9 % (ref 11.5–14.5)
WBC: 25.2 10*3/uL — ABNORMAL HIGH (ref 3.6–11.0)

## 2015-10-14 LAB — PREGNANCY, URINE: PREG TEST UR: NEGATIVE

## 2015-10-14 LAB — POCT PREGNANCY, URINE: PREG TEST UR: NEGATIVE

## 2015-10-14 MED ORDER — LORAZEPAM 2 MG/ML IJ SOLN
1.0000 mg | Freq: Once | INTRAMUSCULAR | Status: AC
  Administered 2015-10-14: 1 mg via INTRAVENOUS

## 2015-10-14 MED ORDER — SODIUM CHLORIDE 0.9 % IV SOLN
INTRAVENOUS | Status: DC
  Administered 2015-10-14 – 2015-10-15 (×2): via INTRAVENOUS
  Administered 2015-10-15: 1000 mL via INTRAVENOUS
  Administered 2015-10-16: 09:00:00 via INTRAVENOUS

## 2015-10-14 MED ORDER — LORAZEPAM 2 MG/ML IJ SOLN
INTRAMUSCULAR | Status: AC
  Administered 2015-10-14: 2 mg via INTRAVENOUS
  Filled 2015-10-14: qty 1

## 2015-10-14 MED ORDER — NAPROXEN 250 MG PO TABS
250.0000 mg | ORAL_TABLET | Freq: Two times a day (BID) | ORAL | Status: DC | PRN
  Filled 2015-10-14: qty 1

## 2015-10-14 MED ORDER — ONDANSETRON HCL 4 MG/2ML IJ SOLN
4.0000 mg | Freq: Once | INTRAMUSCULAR | Status: AC
  Administered 2015-10-14: 4 mg via INTRAVENOUS

## 2015-10-14 MED ORDER — LORAZEPAM 2 MG PO TABS
2.0000 mg | ORAL_TABLET | ORAL | Status: AC
  Filled 2015-10-14: qty 1

## 2015-10-14 MED ORDER — LORAZEPAM 2 MG/ML IJ SOLN
2.0000 mg | INTRAMUSCULAR | Status: DC | PRN
  Filled 2015-10-14: qty 1

## 2015-10-14 MED ORDER — NAPROXEN SODIUM 275 MG PO TABS
220.0000 mg | ORAL_TABLET | Freq: Two times a day (BID) | ORAL | Status: DC | PRN
  Filled 2015-10-14: qty 1

## 2015-10-14 MED ORDER — ENOXAPARIN SODIUM 40 MG/0.4ML ~~LOC~~ SOLN
40.0000 mg | SUBCUTANEOUS | Status: DC
  Administered 2015-10-15: 40 mg via SUBCUTANEOUS
  Filled 2015-10-14 (×2): qty 0.4

## 2015-10-14 MED ORDER — SODIUM CHLORIDE 0.9 % IV SOLN
8.0000 mg | Freq: Once | INTRAVENOUS | Status: DC
  Filled 2015-10-14: qty 4

## 2015-10-14 MED ORDER — LORAZEPAM 2 MG/ML IJ SOLN
2.0000 mg | Freq: Once | INTRAMUSCULAR | Status: AC
  Administered 2015-10-14: 2 mg via INTRAVENOUS

## 2015-10-14 MED ORDER — TRAZODONE HCL 100 MG PO TABS
100.0000 mg | ORAL_TABLET | Freq: Every day | ORAL | Status: DC
  Administered 2015-10-14: 100 mg via ORAL
  Filled 2015-10-14: qty 1

## 2015-10-14 MED ORDER — ONDANSETRON HCL 4 MG PO TABS: 4.0000 mg | ORAL_TABLET | Freq: Four times a day (QID) | ORAL | Status: DC | PRN

## 2015-10-14 MED ORDER — CYCLOBENZAPRINE HCL 10 MG PO TABS: 10.0000 mg | ORAL_TABLET | Freq: Two times a day (BID) | ORAL | Status: DC | PRN

## 2015-10-14 MED ORDER — LORAZEPAM 2 MG/ML IJ SOLN
2.0000 mg | Freq: Four times a day (QID) | INTRAMUSCULAR | Status: DC
  Administered 2015-10-14 – 2015-10-16 (×4): 2 mg via INTRAVENOUS
  Filled 2015-10-14 (×4): qty 1

## 2015-10-14 MED ORDER — ONDANSETRON HCL 4 MG/2ML IJ SOLN
INTRAMUSCULAR | Status: AC
  Administered 2015-10-14: 4 mg via INTRAVENOUS
  Filled 2015-10-14: qty 4

## 2015-10-14 MED ORDER — ONDANSETRON HCL 4 MG/2ML IJ SOLN: 4.0000 mg | Freq: Four times a day (QID) | INTRAMUSCULAR | Status: DC | PRN

## 2015-10-14 MED ORDER — HALOPERIDOL LACTATE 5 MG/ML IJ SOLN
5.0000 mg | Freq: Once | INTRAMUSCULAR | Status: AC
  Administered 2015-10-14: 5 mg via INTRAVENOUS
  Filled 2015-10-14: qty 1

## 2015-10-14 MED ORDER — LORATADINE 10 MG PO TABS
10.0000 mg | ORAL_TABLET | Freq: Every day | ORAL | Status: DC
  Administered 2015-10-14 – 2015-10-16 (×3): 10 mg via ORAL
  Filled 2015-10-14 (×3): qty 1

## 2015-10-14 MED ORDER — MORPHINE SULFATE (PF) 2 MG/ML IV SOLN: 2.0000 mg | INTRAVENOUS | Status: DC | PRN

## 2015-10-14 MED ORDER — ACETAMINOPHEN 650 MG RE SUPP: 650.0000 mg | Freq: Four times a day (QID) | RECTAL | Status: DC | PRN

## 2015-10-14 MED ORDER — LEVETIRACETAM 500 MG PO TABS
500.0000 mg | ORAL_TABLET | Freq: Two times a day (BID) | ORAL | Status: DC
  Administered 2015-10-14: 500 mg via ORAL
  Filled 2015-10-14: qty 1

## 2015-10-14 MED ORDER — ACETAMINOPHEN 325 MG PO TABS: 650.0000 mg | ORAL_TABLET | Freq: Four times a day (QID) | ORAL | Status: DC | PRN

## 2015-10-14 MED ORDER — SODIUM CHLORIDE 0.9% FLUSH
3.0000 mL | Freq: Two times a day (BID) | INTRAVENOUS | Status: DC
  Administered 2015-10-14 – 2015-10-15 (×2): 3 mL via INTRAVENOUS

## 2015-10-14 MED ORDER — SODIUM CHLORIDE 0.9 % IV SOLN
1000.0000 mg | Freq: Once | INTRAVENOUS | Status: AC
  Administered 2015-10-14: 1000 mg via INTRAVENOUS
  Filled 2015-10-14: qty 10

## 2015-10-14 MED ORDER — LORAZEPAM 2 MG/ML IJ SOLN
INTRAMUSCULAR | Status: AC
  Administered 2015-10-14: 1 mg via INTRAVENOUS
  Filled 2015-10-14: qty 1

## 2015-10-14 MED ORDER — OXYCODONE HCL 5 MG PO TABS: 5.0000 mg | ORAL_TABLET | ORAL | Status: DC | PRN

## 2015-10-14 NOTE — ED Notes (Signed)
EEG tech in room with pt, no needs identified at this time

## 2015-10-14 NOTE — Progress Notes (Signed)
Pt slightly more cooperative but still resisting care. No iv access at this moment. MD (patel) notified. Orders for PO ativan x 1 for now to help with placing iv access. VSS. No c/o pain. Pt alert to self and place only. Unable to finish full physical assessment due to pt uncooperative behavior. Family at bedside and very supportive. Will continue to monitor.

## 2015-10-14 NOTE — ED Notes (Signed)
Pt brought back from unsuccessful CT. Pt would not follow commands. Pt is wet from urine, Pt's lienen changed, Pt ripped off all her monitoring equipment. Pt attempted to pull out IVAD which was saved. Pt covered with warm blankets and now pt is resting comfortably. Pt will not tolerate monitoring equipment.

## 2015-10-14 NOTE — Progress Notes (Signed)
Unable to get vital signs r/t pt uncooperative and combative. Md paged.

## 2015-10-14 NOTE — ED Notes (Signed)
This RN notified MD of patient actively seizing. This RN received verbal order for 1 mg of Ativan. Seizure lasted approximately 1 min, grand mal seizure noted at this time.

## 2015-10-14 NOTE — ED Provider Notes (Signed)
[facility] Emergency Department Provider Note   ____________________________________________  Time seen: Approximately 11:05 am, upon ems arrival I have reviewed the triage vital signs and the triage nursing note.  HISTORY  Chief Complaint Seizures   Historian Limited - pt with altered mental status/post ictal History per EMS report  HPI Chandelle Harkey is a 40 y.o. female with reported history seizure disorder, although unknown what medications she is on. The report per EMS was that her roommate had to wake her up this morning in order for her to go to work. She was reportedly in the bathroom at work for 30 minutes when she came out stumbling and then had a witnessed seizure by her boss. EMS was called and was postictal when they picked her up. She did have an additional seizure in the ambulance. IV access was obtained. Patient did receive 4 mg Valium and patient was postictal upon arrival.  Unknown if she has medication compliance.    Past Medical History  Diagnosis Date  . Seizures (HCC)     There are no active problems to display for this patient.   No past surgical history on file.  Current Outpatient Rx  Name  Route  Sig  Dispense  Refill  . cyclobenzaprine (FLEXERIL) 10 MG tablet   Oral   Take 1 tablet by mouth 2 (two) times daily as needed for muscle spasms.          Marland Kitchen levETIRAcetam (KEPPRA) 500 MG tablet   Oral   Take 1 tablet by mouth 2 (two) times daily.         Marland Kitchen loratadine (CLARITIN) 10 MG tablet   Oral   Take 10 mg by mouth daily.         . naproxen sodium (ANAPROX) 220 MG tablet   Oral   Take 220 mg by mouth 2 (two) times daily as needed (for headache).         . traZODone (DESYREL) 100 MG tablet   Oral   Take 1 tablet by mouth at bedtime.           Allergies Review of patient's allergies indicates no known allergies.  No family history on file.  Social History Social History  Substance Use Topics  .  Smoking status: Current Every Day Smoker  . Smokeless tobacco: None  . Alcohol Use: None    Review of Systems  Constitutional:  Eyes: . ENT:  Cardiovascular: . Respiratory: Marland Kitchen Gastrointestinal:  Genitourinary: Incontinent of urine Musculoskeletal:  Skin:  Neurological:. Limited due to AMS, post ictal ____________________________________________   PHYSICAL EXAM:  VITAL SIGNS: ED Triage Vitals  Enc Vitals Group     BP --      Pulse --      Resp --      Temp --      Temp src --      SpO2 --      Weight --      Height --      Head Cir --      Peak Flow --      Pain Score --      Pain Loc --      Pain Edu? --      Excl. in GC? --      Constitutional: Eyes closed, occasional moan.  Protecting airway.  HEENT   Head: Normocephalic and atraumatic.      Eyes: Conjunctivae are normal. PERRL. Normal extraocular movements.      Ears:  Nose: No congestion/rhinnorhea.   Mouth/Throat: Mucous membranes are moist.   Neck: No stridor. Cardiovascular/Chest: Normal rate, regular rhythm.  No murmurs, rubs, or gallops. Respiratory: Normal respiratory effort without tachypnea nor retractions. Breath sounds are clear and equal bilaterally. No wheezes/rales/rhonchi. Gastrointestinal: Soft. No distention.  No apparent tenderness Genitourinary/rectal:Deferred Musculoskeletal: Pelvis stable, no apparent trauma to extremities Neurologic: Occasional moaning, occasional eye opening.  Not following commands.  No facial droop.  Moving 4 extremities purposefully. Skin:  Skin is warm, dry and intact. No rash noted.   ____________________________________________   EKG I, Governor Rooksebecca Deasha Clendenin, MD, the attending physician have personally viewed and interpreted all ECGs.  104 beats minute. Sinus tachycardia. Narrow QRS. Normal axis. Nonspecific ST-T wave ____________________________________________  LABS (pertinent positives/negatives)  Pregnancy test negative Urinalysis trace  ketones otherwise negative Urine drug screen positive for tricyclic, cocaine, and benzodiazepine Basic metabolic panel without significant abnormality White blood count 25.2, hemoglobin 14.9  ____________________________________________  RADIOLOGY All Xrays were viewed by me. Imaging interpreted by Radiologist.  CT head noncontrast:  No acute abnormality. This study is somewhat limited by motion. __________________________________________  PROCEDURES  Procedure(s) performed: None  Critical Care performed: CRITICAL CARE Performed by: Governor RooksLORD, Luba Matzen   Total critical care time: 60 minutes  Critical care time was exclusive of separately billable procedures and treating other patients.  Critical care was necessary to treat or prevent imminent or life-threatening deterioration.  Critical care was time spent personally by me on the following activities: development of treatment plan with patient and/or surrogate as well as nursing, discussions with consultants, evaluation of patient's response to treatment, examination of patient, obtaining history from patient or surrogate, ordering and performing treatments and interventions, ordering and review of laboratory studies, ordering and review of radiographic studies, pulse oximetry and re-evaluation of patient's condition.   ____________________________________________   ED COURSE / ASSESSMENT AND PLAN  Pertinent labs & imaging results that were available during my care of the patient were reviewed by me and considered in my medical decision making (see chart for details).   There is a report that the patient has history of seizures, but she is altered/likely postictal and unable to provide any history. Our system does not have any report of her seizure diagnosis or what medication she might be taking.  There is at least a 30 minute period where she was in the bathroom unaccounted for, and came out altered and then had a witnessed  seizure. Given that she has had 3 seizures now and is still altered and there is a time perios unaccounted for, I will go ahead and CT her head to make sure there is no evidence of traumatic injury.  I was called back to the patient's room because of active seizure, patient was on nonrebreather, seizure cushions are in place, nasal trumpet was placed, patient was given 1 mg of Ativan and seizure did extinguish itself just prior to Ativan push at less than 5 minutes total seizure time.  I was later called back to the patient's room because she was extremely agitated, as is consistent with a postictal state. Patient had one-on-one sitter. She was given additional 2 mg Ativan for acute agitation. I still want to obtain head CT.  I did discuss this case with neurologist, Dr. Thad Rangereynolds, who recommends IV load Keppra 1 g now, and admitted for prolonged postictal state.  EEG tech in the ED.  CONSULTATIONS:   Face-to-face with Dr. Thad Rangereynolds, neurology.  Hospitalist to admit.   Patient / Family /  Caregiver informed of clinical course, medical decision-making process, and agree with plan.    ___________________________________________   FINAL CLINICAL IMPRESSION(S) / ED DIAGNOSES   Final diagnoses:  Seizure (HCC)              Note: This dictation was prepared with Dragon dictation. Any transcriptional errors that result from this process are unintentional   Governor Rooks, MD 10/14/15 1729

## 2015-10-14 NOTE — ED Notes (Signed)
Pt presents post-ictal after having had multiple seizures at work today. Pt presents via [facility] with 18g to R AC. Per EMS the staff at her job said pt disappeared into the bathroom for approx 30 mins before she "stumbled out, tripped, and had second seizure". Per EMS pt had tonic clonic seizure, then had a 3rd seizure in EMS presence lasting approx 1.5 mins. Pt presents unresponsive at this time.

## 2015-10-14 NOTE — ED Notes (Signed)
Pt was sitting up, attempting to get out of bed. Pt was provided with warm blankets. Pt laid back down, family at bedside and was instructed to notify nurse if patient tried to get back up.

## 2015-10-14 NOTE — H&P (Signed)
[facility] [place] [facility]   PATIENT NAME: Valerie Castillo    MR#:  [id number]  DATE OF BIRTH:  10-18-1975   DATE OF ADMISSION:  10/14/2015  PRIMARY CARE PHYSICIAN: No PCP Per Patient   REQUESTING/REFERRING PHYSICIAN: Lord  CHIEF COMPLAINT:   Chief Complaint  Patient presents with  . Seizures    HISTORY OF PRESENT ILLNESS:  Valerie Castillo  is a 40 y.o. female with a known history of seizure disorder presented with seizure activity. Patient unable to provide meaningful information given mental status medical condition History obtained from family members present at bedside.  She's had 3 seizures generalized tonic-clonic today. She has been fairly well controlled over the past several months. The family is unable to state whether she is reliably taking her medication Keppra 500 mg by mouth twice a day was a previous dose. At the advice of neurology she was loaded with IV Keppra in the emergency department she remains post ictal confused and agitated Past Medical History  Diagnosis Date  . Seizures (HCC)     PAST SURGICAL HISTORY:  History reviewed. No pertinent past surgical history.  SOCIAL HISTORY:   Social History  Substance Use Topics  . Smoking status: Current Every Day Smoker  . Smokeless tobacco: Not on file  . Alcohol Use: No    FAMILY HISTORY:   Family History  Problem Relation Age of Onset  . Diabetes Neg Hx     DRUG ALLERGIES:  No Known Allergies  REVIEW OF SYSTEMS:  Unable to obtain given patient's mental status medical condition   MEDICATIONS AT HOME:   Prior to Admission medications   Medication Sig Start Date End Date Taking? Authorizing Provider  cyclobenzaprine (FLEXERIL) 10 MG tablet Take 1 tablet by mouth 2 (two) times daily as needed for muscle spasms.  09/28/15 10/28/15 Yes Historical Provider, MD  levETIRAcetam (KEPPRA) 500 MG tablet Take 1 tablet by mouth 2 (two) times daily. 09/07/15 09/06/16 Yes Historical  Provider, MD  loratadine (CLARITIN) 10 MG tablet Take 10 mg by mouth daily.   Yes Historical Provider, MD  naproxen sodium (ANAPROX) 220 MG tablet Take 220 mg by mouth 2 (two) times daily as needed (for headache).   Yes Historical Provider, MD  traZODone (DESYREL) 100 MG tablet Take 1 tablet by mouth at bedtime. 09/07/15 12/06/15 Yes Historical Provider, MD      VITAL SIGNS:  Blood pressure 94/75, pulse 93, resp. rate 17, height 5\' 6"  (1.676 m), weight 125 lb 10.6 oz (57 kg), SpO2 97 %.  PHYSICAL EXAMINATION:   VITAL SIGNS: Filed Vitals:   10/14/15 1630 10/14/15 1700  BP: 104/57 94/75  Pulse: 99 93  Resp:     GENERAL:39 y.o.female moderate distress given mental status.  HEAD: Normocephalic, atraumatic.  EYES: Pupils equal, round, reactive to light. Unable to assess extraocular muscles given mental status/medical condition. No scleral icterus.  MOUTH: Moist mucosal membrane. Dentition intact. No abscess noted.  EAR, NOSE, THROAT: Clear without exudates. No external lesions.  NECK: Supple. No thyromegaly. No nodules. No JVD.  PULMONARY: Clear to ascultation, without wheeze rails or rhonci. No use of accessory muscles, Good respiratory effort. good air entry bilaterally CHEST: Nontender to palpation.  CARDIOVASCULAR: S1 and S2. Regular rate and rhythm. No murmurs, rubs, or gallops. No edema. Pedal pulses 2+ bilaterally.  GASTROINTESTINAL: Soft, nontender, nondistended. No masses. Positive bowel sounds. No hepatosplenomegaly.  MUSCULOSKELETAL: No swelling, clubbing, or edema. Range of motion full in all extremities.  NEUROLOGIC: Unable to assess given mental status/medical condition SKIN: No ulceration, lesions, rashes, or cyanosis. Skin warm and dry. Turgor intact.  PSYCHIATRIC: Unable to assess given mental status/medical condition-agitated     LABORATORY PANEL:   CBC  Recent Labs Lab 10/14/15 1208  WBC 25.2*  HGB 14.9  HCT 45.4  PLT 331    ------------------------------------------------------------------------------------------------------------------  Chemistries   Recent Labs Lab 10/14/15 1208  NA 136  K 3.6  CL 104  CO2 22  GLUCOSE 90  BUN 10  CREATININE 0.81  CALCIUM 8.6*   ------------------------------------------------------------------------------------------------------------------  Cardiac Enzymes No results for input(s): TROPONINI in the last 168 hours. ------------------------------------------------------------------------------------------------------------------  RADIOLOGY:  Ct Head Wo Contrast  10/14/2015  CLINICAL DATA:  Seizure. EXAM: CT HEAD WITHOUT CONTRAST TECHNIQUE: Contiguous axial images were obtained from the base of the skull through the vertex without intravenous contrast. COMPARISON:  June 12, 2013 FINDINGS: The study is limited due to positioning and mild motion. The paranasal sinuses, mastoid air cells, and middle ears are well aerated. The bones are intact with no fractures identified. Extracranial soft tissues are normal. No subdural, epidural, or subarachnoid hemorrhage. No mass, mass effect, or midline shift. The ventricles and sulci are normal. The cerebellum, brainstem, and basal cisterns are within normal limits. No evidence of acute cortical ischemia or infarct. IMPRESSION: No acute abnormality.  The study is somewhat limited by motion. Electronically Signed   By: Gerome Sam III M.D   On: 10/14/2015 16:59    EKG:   Orders placed or performed during the hospital encounter of 10/14/15  . EKG 12-Lead  . EKG 12-Lead  . EKG 12-Lead  . EKG 12-Lead    IMPRESSION AND PLAN:   40 year old Caucasian female history of seizure disorders presenting with increased seizure activity  1. Seizure activity: IV Keppra load continue home dose Keppra neurology consult-are contacted, patient is positive for cocaine which is a likely culprit of worsening seizure activity 2. Cocaine  abuse: 3. Leukocytosis in setting of seizure no evidence of infection noted indication for antibiotics 4. Venous thromboembolism prophylactic: Lovenox    All the records are reviewed and case discussed with ED provider. Management plans discussed with the patient, family and they are in agreement.  CODE STATUS:  fullTOTAL TIME TAKING CARE OF THIS PATIENT33 minutes.    Clariza Sickman,  Mardi Mainland.D on 10/14/2015 at 5:59 PM  Between 7am to 6pm - Pager - [phone]  After 6pm: House Pager: - [phone]  [facility]  Office  [phone]  CC: Primary care physician; No PCP Per Patient

## 2015-10-14 NOTE — ED Notes (Signed)
Assumed car of Pt at 1500. Pt not currently in room. Pt at CT with RN

## 2015-10-14 NOTE — ED Notes (Signed)
EEG in room with pt at this time

## 2015-10-14 NOTE — Procedures (Signed)
ELECTROENCEPHALOGRAM REPORT   Patient: Valerie Castillo       Room #: 225A-AA EEG No. ID: [id number] Age: 40 y.o.        Sex: female Referring Physician: Hower Report Date:  10/14/2015        Interpreting Physician: Thana FarrEYNOLDS, Jameria Bradway  History: Valerie Mallingania Reist is an 40 y.o. female with a history of seizures presenting with breakthrough seizures.  S/P multiple doses of benzodiazepines.    Medications:  Scheduled: . enoxaparin (LOVENOX) injection  40 mg Subcutaneous Q24H  . levETIRAcetam  1,000 mg Oral BID  . loratadine  10 mg Oral Daily  . LORazepam  2 mg Intravenous Q6H  . LORazepam  2 mg Oral STAT  . sodium chloride flush  3 mL Intravenous Q12H  . traZODone  100 mg Oral QHS    Conditions of Recording:  This is a 16 channel EEG carried out with the patient in the awake and poorly responsive state.  Description:  The patient is poorly responsive and in the fetal position for the majority of the recording.  During this time the background rhythm is slow, poorly organized but continuous.  It consists of a low voltage polymorphic delta activity.  There are superimposed periods of well organized beta activity that at times appear to resemble sleep spindles.  When alerted the background activity is reactive and faster rhythms are noted.   No epileptiform activity is noted.   Hyperventilation and intermittent photic stimulation were not performed.   IMPRESSION: This electroencephalogram is characterized by slowing and superimposed beta activity consistent with the patient's medications.  No epileptiform activity is noted.     Thana FarrLeslie Millenia Waldvogel, MD Neurology [phone] 10/14/2015, 9:07 PM

## 2015-10-15 LAB — CBC
HCT: 42.2 % (ref 35.0–47.0)
Hemoglobin: 14.1 g/dL (ref 12.0–16.0)
MCH: 30.5 pg (ref 26.0–34.0)
MCHC: 33.4 g/dL (ref 32.0–36.0)
MCV: 91.3 fL (ref 80.0–100.0)
PLATELETS: 284 10*3/uL (ref 150–440)
RBC: 4.62 MIL/uL (ref 3.80–5.20)
RDW: 13.9 % (ref 11.5–14.5)
WBC: 15.1 10*3/uL — ABNORMAL HIGH (ref 3.6–11.0)

## 2015-10-15 LAB — MAGNESIUM: Magnesium: 2 mg/dL (ref 1.7–2.4)

## 2015-10-15 LAB — BASIC METABOLIC PANEL
Anion gap: 7 (ref 5–15)
BUN: 22 mg/dL — AB (ref 6–20)
CO2: 20 mmol/L — ABNORMAL LOW (ref 22–32)
CREATININE: 0.78 mg/dL (ref 0.44–1.00)
Calcium: 8.5 mg/dL — ABNORMAL LOW (ref 8.9–10.3)
Chloride: 106 mmol/L (ref 101–111)
GFR calc Af Amer: >60 mL/min (ref >60)
GFR calc non Af Amer: >60 mL/min (ref >60)
GLUCOSE: 79 mg/dL (ref 65–99)
POTASSIUM: 3.6 mmol/L (ref 3.5–5.1)
Sodium: 133 mmol/L — ABNORMAL LOW (ref 135–145)

## 2015-10-15 MED ORDER — LEVETIRACETAM 500 MG PO TABS
1000.0000 mg | ORAL_TABLET | Freq: Two times a day (BID) | ORAL | Status: DC
  Administered 2015-10-15 – 2015-10-16 (×3): 1000 mg via ORAL
  Filled 2015-10-15 (×3): qty 2

## 2015-10-15 NOTE — Care Management (Signed)
Patient admitted with seizure activity.  Patient positive for benzos and cocaine.  Patient asleep at this time and history obtained from parents who are at bedside.  Mother states that patient lives at home with her boyfriend.  Patient works part time and is uninsured.  Patient has applied for Medicaid and was denied.  [facility] and is able to afford her medication.  Patient does not have PCP.  [facility], and Medication Management applications given to the patient's mother.  RNCM available for discharge medications if needed.

## 2015-10-15 NOTE — Clinical Social Work Note (Signed)
CSW consulted to provide medicaid information to patient. RN CM has seen patient and patient is aware of medicaid and how to contact them. Per RN CM, patient has applied for medicaid and was denied. CSW has cancelled consult. Please reconsult CSW if needed. York SpanielMonica Lesean Woolverton MSW,LCSW [phone]

## 2015-10-15 NOTE — Consult Note (Signed)
Reason for Consult:Seizures Referring Physician: Nemiah Commander  CC: Seizures  HPI: Valerie Castillo is an 40 y.o. female with a reported seizure disorder history on Keppra.  Patient reports compliance. The report per EMS was that her roommate had to wake her up yesterday morning in order for her to go to work. She was reportedly in the bathroom at work for 30 minutes when she came out stumbling and then had a witnessed seizure by her boss. EMS was called and was postictal when they picked her up. She did have an additional seizure in the ambulance. IV access was obtained. Patient did receive 4 mg Valium and patient was postictal upon arrival.  In the ED was noted to have another seizure.     Past Medical History  Diagnosis Date  . Seizures (HCC)     History reviewed. No pertinent past surgical history.  Family History  Problem Relation Age of Onset  . Diabetes Neg Hx     Social History:  reports that she has been smoking.  She does not have any smokeless tobacco history on file. She reports that she uses illicit drugs (Cocaine). She reports that she does not drink alcohol.  No Known Allergies  Medications:  I have reviewed the patient's current medications. Prior to Admission:  Prescriptions prior to admission  Medication Sig Dispense Refill Last Dose  . cyclobenzaprine (FLEXERIL) 10 MG tablet Take 1 tablet by mouth 2 (two) times daily as needed for muscle spasms.    PRN at PRN  . levETIRAcetam (KEPPRA) 500 MG tablet Take 1 tablet by mouth 2 (two) times daily.   unknown at unknown  . loratadine (CLARITIN) 10 MG tablet Take 10 mg by mouth daily.   unknown at unknown  . naproxen sodium (ANAPROX) 220 MG tablet Take 220 mg by mouth 2 (two) times daily as needed (for headache).   PRN at PRN  . traZODone (DESYREL) 100 MG tablet Take 1 tablet by mouth at bedtime.   unknown at unknown   Scheduled: . enoxaparin (LOVENOX) injection  40 mg Subcutaneous Q24H  . levETIRAcetam  500 mg Oral BID  .  loratadine  10 mg Oral Daily  . LORazepam  2 mg Intravenous Q6H  . LORazepam  2 mg Oral STAT  . sodium chloride flush  3 mL Intravenous Q12H  . traZODone  100 mg Oral QHS    ROS: History obtained from the patient  General ROS:  fatigue Psychological ROS: negative for - behavioral disorder, hallucinations, memory difficulties, mood swings or suicidal ideation Ophthalmic ROS: negative for - blurry vision, double vision, eye pain or loss of vision ENT ROS: negative for - epistaxis, nasal discharge, oral lesions, sore throat, tinnitus or vertigo Allergy and Immunology ROS: negative for - hives or itchy/watery eyes Hematological and Lymphatic ROS: negative for - bleeding problems, bruising or swollen lymph nodes Endocrine ROS: negative for - galactorrhea, hair pattern changes, polydipsia/polyuria or temperature intolerance Respiratory ROS: negative for - cough, hemoptysis, shortness of breath or wheezing Cardiovascular ROS: negative for - chest pain, dyspnea on exertion, edema or irregular heartbeat Gastrointestinal ROS: negative for - abdominal pain, diarrhea, hematemesis, nausea/vomiting or stool incontinence Genito-Urinary ROS: negative for - dysuria, hematuria, incontinence or urinary frequency/urgency Musculoskeletal ROS: muscular soreness Neurological ROS: as noted in HPI Dermatological ROS: negative for rash and skin lesion changes  Physical Examination: Blood pressure 125/81, pulse 93, temperature 98.8 F (37.1 C), temperature source Oral, resp. rate 20, height  (1.676 m), weight 53.388 kg (117  lb 11.2 oz), SpO2 98 %.  HEENT-  Normocephalic, no lesions, without obvious abnormality.  Normal external eye and conjunctiva.  Normal TM's bilaterally.  Normal auditory canals and external ears. Normal external nose, mucus membranes and septum.  Normal pharynx. Cardiovascular- S1, S2 normal, pulses palpable throughout   Lungs- chest clear, no wheezing, rales, normal symmetric air  entry Abdomen- soft, non-tender; bowel sounds normal; no masses,  no organomegaly Extremities- no edema Lymph-no adenopathy palpable Musculoskeletal-no joint tenderness, deformity or swelling Skin-warm and dry, no hyperpigmentation, vitiligo, or suspicious lesions  Neurological Examination Mental Status: Lethargic.  Oriented to name and place although unclear what hospital she is in. Speech fluent but slurred.  Able to follow commands. Cranial Nerves: II: Discs flat bilaterally; Blinks to bilateral confrontation, pupils equal, round, reactive to light and accommodation III,IV, VI: ptosis not present, extra-ocular motions intact bilaterally V,VII: smile symmetric, facial light touch sensation normal bilaterally VIII: hearing normal bilaterally IX,X: gag reflex present XI: bilateral shoulder shrug XII: midline tongue extension Motor: Lifts all extremities against gravity with no focal weakness noted.  Does not provide resistance Sensory: Responds to light noxious stimuli throughout Deep Tendon Reflexes: 2+ in the upper extremities and absent in the lower extremities Plantars: Right: upgoing   Left: upgoing Cerebellar: Normal finger-to-nose testing Gait: not tested due to safety concerns      Laboratory Studies:   Basic Metabolic Panel:  Recent Labs Lab 10/14/15 1208  NA 136  K 3.6  CL 104  CO2 22  GLUCOSE 90  BUN 10  CREATININE 0.81  CALCIUM 8.6*    Liver Function Tests: No results for input(s): AST, ALT, ALKPHOS, BILITOT, PROT, ALBUMIN in the last 168 hours. No results for input(s): LIPASE, AMYLASE in the last 168 hours. No results for input(s): AMMONIA in the last 168 hours.  CBC:  Recent Labs Lab 10/14/15 1208  WBC 25.2*  NEUTROABS 22.8*  HGB 14.9  HCT 45.4  MCV 92.0  PLT 331    Cardiac Enzymes: No results for input(s): CKTOTAL, CKMB, CKMBINDEX, TROPONINI in the last 168 hours.  BNP: Invalid input(s): POCBNP  CBG:  Recent Labs Lab  10/14/15 1115  GLUCAP 154*    Microbiology: Results for orders placed or performed in visit on 06/12/13  Culture, blood (single)     Status: None   Collection Time: 06/12/13 12:30 PM  Result Value Ref Range Status   Micro Text Report   Final       SOURCE: right arm    COMMENT                   NO GROWTH AEROBICALLY/ANAEROBICALLY IN 5 DAYS   ANTIBIOTIC                                                      Culture, blood (single)     Status: None   Collection Time: 06/12/13 12:37 PM  Result Value Ref Range Status   Micro Text Report   Final       SOURCE: left hand    COMMENT                   NO GROWTH AEROBICALLY/ANAEROBICALLY IN 5 DAYS   ANTIBIOTIC  Urine culture     Status: None   Collection Time: 06/12/13 12:37 PM  Result Value Ref Range Status   Micro Text Report   Final       SOURCE: INDWELLING CATHETER    COMMENT                   NO GROWTH IN 36 HOURS   ANTIBIOTIC                                                      CSF culture     Status: None   Collection Time: 06/12/13  3:07 PM  Result Value Ref Range Status   Micro Text Report   Final       COMMENT                   NO GROWTH AEROBICALLY IN 3 DAYS   GRAM STAIN                NO ORGANISMS SEEN NO WBC'S SEEN WHITE BLOOD CELLS   ANTIBIOTIC                                                      Influenza A&B Antigens [facility])     Status: None   Collection Time: 06/12/13  5:18 PM  Result Value Ref Range Status   Micro Text Report   Final       COMMENT                   NEGATIVE FOR INFLUENZA A (ANTIGEN ABSENT)   COMMENT                   NEGATIVE FOR INFLUENZA B (ANTIGEN ABSENT)   ANTIBIOTIC                                                        Coagulation Studies: No results for input(s): LABPROT, INR in the last 72 hours.  Urinalysis:  Recent Labs Lab 10/14/15 1209  COLORURINE YELLOW*  LABSPEC 1.016  PHURINE 6.0  GLUCOSEU NEGATIVE   HGBUR 2+*  BILIRUBINUR NEGATIVE  KETONESUR TRACE*  PROTEINUR 30*  NITRITE NEGATIVE  LEUKOCYTESUR NEGATIVE    Lipid Panel:  No results found for: CHOL, TRIG, HDL, CHOLHDL, VLDL, LDLCALC  HgbA1C: No results found for: HGBA1C  Urine Drug Screen:     Component Value Date/Time   LABOPIA NONE DETECTED 10/14/2015 1209   LABBENZ POSITIVE* 10/14/2015 1209   AMPHETMU NONE DETECTED 10/14/2015 1209   THCU NONE DETECTED 10/14/2015 1209   LABBARB NONE DETECTED 10/14/2015 1209    Alcohol Level: No results for input(s): ETH in the last 168 hours.  Other results: EKG: sinus rhythm at 84 bpm, LAE.  Imaging: Ct Head Wo Contrast  10/14/2015  CLINICAL DATA:  Seizure. EXAM: CT HEAD WITHOUT CONTRAST TECHNIQUE: Contiguous axial images were obtained from the base of the skull through the vertex without intravenous contrast. COMPARISON:  June 12, 2013  FINDINGS: The study is limited due to positioning and mild motion. The paranasal sinuses, mastoid air cells, and middle ears are well aerated. The bones are intact with no fractures identified. Extracranial soft tissues are normal. No subdural, epidural, or subarachnoid hemorrhage. No mass, mass effect, or midline shift. The ventricles and sulci are normal. The cerebellum, brainstem, and basal cisterns are within normal limits. No evidence of acute cortical ischemia or infarct. IMPRESSION: No acute abnormality.  The study is somewhat limited by motion. Electronically Signed   By: Gerome Sam III M.D   On: 10/14/2015 16:59     Assessment/Plan: 40 year old female with a history of seizures presenting with multiple breakthrough seizures.  Reports compliance.  Drug screen positive for cocaine and tricyclics.  Reportedly patient taking Keppra 500mg  BID at home.  Loaded with Keppra in ED.   EEG performed on yesterday due to prolonged lethargy to rule out subclinical seizure activity.  No subclinical seizure activity noted.    Recommendations: 1.  To  continue Keppra at an increased dose of 1000mg  BID 2.  Seizure precautions 3.  Patient unable to drive, operate heavy machinery, perform activities at heights and participate in water activities until release by outpatient physician.  Thana Farr, MD Neurology [phone] 10/15/2015, 9:24 AM

## 2015-10-15 NOTE — Progress Notes (Signed)
[facility] [place] at [facility]   PATIENT NAME: Valerie Castillo    MR#:  [id number]  DATE OF BIRTH:  08-Jun-1976  SUBJECTIVE:  CHIEF COMPLAINT:   Chief Complaint  Patient presents with  . Seizures   -Known history of seizures, admitted with multiple seizure episodes. -Very confused and agitated last night. Received Haldol and also Ativan about 3 years ago this morning. -Very sleepy but arousable and oriented 2 today. -Urine tox positive for cocaine  REVIEW OF SYSTEMS:  Review of Systems  Unable to perform ROS: mental acuity    DRUG ALLERGIES:  No Known Allergies  VITALS:  Blood pressure 125/81, pulse 93, temperature 98.8 F (37.1 C), temperature source Oral, resp. rate 20, height 5\' 6"  (1.676 m), weight 53.388 kg (117 lb 11.2 oz), SpO2 98 %.  PHYSICAL EXAMINATION:  Physical Exam  GENERAL:  40 y.o.-year-old patient lying in the bed with no acute distress.  EYES: Pupils equal, round, reactive to light and accommodation. No scleral icterus. Extraocular muscles intact.  HEENT: Head atraumatic, normocephalic. Oropharynx and nasopharynx clear. Poor dental hygiene NECK:  Supple, no jugular venous distention. No thyroid enlargement, no tenderness.  LUNGS: Normal breath sounds bilaterally, no wheezing, rales,rhonchi or crepitation. No use of accessory muscles of respiration.  CARDIOVASCULAR: S1, S2 normal. No murmurs, rubs, or gallops.  ABDOMEN: Soft, nontender, nondistended. Bowel sounds present. No organomegaly or mass.  EXTREMITIES: No pedal edema, cyanosis, or clubbing.  NEUROLOGIC: Cranial nerves II through XII are intact. Muscle strength 5/5 in all extremities. Sensation intact. Gait not checked. Following commands when aroused. PSYCHIATRIC: The patient is sleepy, arousable and oriented 2. Following commands.Marland Kitchen.  SKIN: No obvious rash, lesion, or ulcer.    LABORATORY PANEL:   CBC  Recent Labs Lab 10/14/15 1208  WBC 25.2*  HGB 14.9  HCT 45.4   PLT 331   ------------------------------------------------------------------------------------------------------------------  Chemistries   Recent Labs Lab 10/14/15 1208  NA 136  K 3.6  CL 104  CO2 22  GLUCOSE 90  BUN 10  CREATININE 0.81  CALCIUM 8.6*   ------------------------------------------------------------------------------------------------------------------  Cardiac Enzymes No results for input(s): TROPONINI in the last 168 hours. ------------------------------------------------------------------------------------------------------------------  RADIOLOGY:  Ct Head Wo Contrast  10/14/2015  CLINICAL DATA:  Seizure. EXAM: CT HEAD WITHOUT CONTRAST TECHNIQUE: Contiguous axial images were obtained from the base of the skull through the vertex without intravenous contrast. COMPARISON:  June 12, 2013 FINDINGS: The study is limited due to positioning and mild motion. The paranasal sinuses, mastoid air cells, and middle ears are well aerated. The bones are intact with no fractures identified. Extracranial soft tissues are normal. No subdural, epidural, or subarachnoid hemorrhage. No mass, mass effect, or midline shift. The ventricles and sulci are normal. The cerebellum, brainstem, and basal cisterns are within normal limits. No evidence of acute cortical ischemia or infarct. IMPRESSION: No acute abnormality.  The study is somewhat limited by motion. Electronically Signed   By: Gerome Samavid  Williams III M.D   On: 10/14/2015 16:59    EKG:   Orders placed or performed during the hospital encounter of 10/14/15  . EKG 12-Lead  . EKG 12-Lead  . EKG 12-Lead  . EKG 12-Lead    ASSESSMENT AND PLAN:   40 y/o F known history of seizure disorder on Keppra, questionable noncompliance presents to the hospital secondary to multiple seizure episodes.  #1 seizure activity with known history of seizures-probably triggered by cocaine. -Possible noncompliance with medications as  well. -Received Ativan this morning. Continue  Ativan when necessary. Loaded with Keppra and continue Keppra. -Patient still sleepy. Neuro checks -Appreciate neurology consult. -Check magnesium level  #2 substance abuse-urine tox positive for cocaine and barbiturates. Counseled when more awake.  #3 leukocytosis-stress reaction from seizure disorder. No evidence of any infection identified. -No antibiotics. Follow-up CBC today  #4 depression-on trazodone.  #5 DVT prophylaxis-on Lovenox    All the records are reviewed and case discussed with Care Management/Social Workerr. Management plans discussed with the patient, family and they are in agreement.  CODE STATUS: full code  TOTAL TIME TAKING CARE OF THIS PATIENT: 36 minutes.   POSSIBLE D/C IN 1-2 DAYS, DEPENDING ON CLINICAL CONDITION.   Enid Baas M.D on 10/15/2015 at 9:17 AM  Between 7am to 6pm - Pager - [phone]  After 6pm go to [email or web address] - password [email or web address] [facility]  [place] [facility]  Office  [phone]  CC: Primary care physician; No PCP Per Patient

## 2015-10-16 MED ORDER — LEVETIRACETAM 1000 MG PO TABS: 1000.0000 mg | ORAL_TABLET | Freq: Two times a day (BID) | ORAL | Status: AC

## 2015-10-16 MED ORDER — GUAIFENESIN ER 600 MG PO TB12: 600.0000 mg | ORAL_TABLET | Freq: Two times a day (BID) | ORAL | Status: AC

## 2015-10-16 NOTE — Progress Notes (Signed)
Alert and oriented. Vss. No signs of acute distress. Discharge instructions given. Patient verbalizes understanding. Education given regarding [facility] and medication management as well.

## 2015-10-17 NOTE — Discharge Summary (Signed)
[facility] - [place] at [facility]   PATIENT NAME: Valerie Castillo    MR#:  [id number]  DATE OF BIRTH:  [birth year]  DATE OF ADMISSION:  10/14/2015 ADMITTING PHYSICIAN: Wyatt Haste, MD  DATE OF DISCHARGE: 10/16/2015 11:52 AM  PRIMARY CARE PHYSICIAN: No PCP Per Patient    ADMISSION DIAGNOSIS:  Seizure (HCC) [R56.9]  DISCHARGE DIAGNOSIS:  Active Problems:   Seizure (HCC)   SECONDARY DIAGNOSIS:   Past Medical History  Diagnosis Date  . Seizures [facility])     HOSPITAL COURSE:   40 y/o F known history of seizure disorder on Keppra, questionable noncompliance presents to the hospital secondary to multiple seizure episodes.  #1 seizure activity with known history of seizures-probably triggered by cocaine. -Possible noncompliance with medications as well. -Loaded with Keppra and continue Keppra. keppra dose has been increased to 1000 mg bid -Appreciate neurology consult. - outpatient neurology f/u recommended   #2 substance abuse-urine tox positive for cocaine and barbiturates. Counseled prior to discharge  #3 leukocytosis-stress reaction from seizure disorder. No evidence of any infection identified. -No antibiotics. Improving.  #4 depression-on trazodone.  Instructions given to the patient and also her parents at bedside. Will be discharged home.  DISCHARGE CONDITIONS:   Stable  CONSULTS OBTAINED:  Treatment Team:  Kym Groom, MD Wyatt Haste, MD Thana Farr, MD  DRUG ALLERGIES:  No Known Allergies  DISCHARGE MEDICATIONS:   Discharge Medication List as of 10/16/2015 10:58 AM    START taking these medications   Details  guaiFENesin (MUCINEX) 600 MG 12 hr tablet Take 1 tablet (600 mg total) by mouth 2 (two) times daily., Starting 10/16/2015, Until Discontinued, Print      CONTINUE these medications which have CHANGED   Details  levETIRAcetam (KEPPRA) 1000 MG tablet Take 1 tablet (1,000 mg total) by mouth 2 (two) times daily.,  Starting 10/16/2015, Until Discontinued, Print      CONTINUE these medications which have NOT CHANGED   Details  cyclobenzaprine (FLEXERIL) 10 MG tablet Take 1 tablet by mouth 2 (two) times daily as needed for muscle spasms. , Starting 09/28/2015, Until Thu 10/28/15, Historical Med    loratadine (CLARITIN) 10 MG tablet Take 10 mg by mouth daily., Until Discontinued, Historical Med    naproxen sodium (ANAPROX) 220 MG tablet Take 220 mg by mouth 2 (two) times daily as needed (for headache)., Until Discontinued, Historical Med    traZODone (DESYREL) 100 MG tablet Take 1 tablet by mouth at bedtime., Starting 09/07/2015, Until Mon 12/06/15, Historical Med         DISCHARGE INSTRUCTIONS:   1. PCP f/u in 1-2 weeks 2. No driving 3. Avoid drugs  If you experience worsening of your admission symptoms, develop shortness of breath, life threatening emergency, suicidal or homicidal thoughts you must seek medical attention immediately by calling 911 or calling your MD immediately  if symptoms less severe.  You Must read complete instructions/literature along with all the possible adverse reactions/side effects for all the Medicines you take and that have been prescribed to you. Take any new Medicines after you have completely understood and accept all the possible adverse reactions/side effects.   Please note  You were cared for by a hospitalist during your hospital stay. If you have any questions about your discharge medications or the care you received while you were in the hospital after you are discharged, you can call the unit and asked to speak with the hospitalist on call if the hospitalist that  took care of you is not available. Once you are discharged, your primary care physician will handle any further medical issues. Please note that NO REFILLS for any discharge medications will be authorized once you are discharged, as it is imperative that you return to your primary care physician (or establish  a relationship with a primary care physician if you do not have one) for your aftercare needs so that they can reassess your need for medications and monitor your lab values.    Today   CHIEF COMPLAINT:   Chief Complaint  Patient presents with  . Seizures    VITAL SIGNS:  Blood pressure 106/64, pulse 70, temperature 98.6 F (37 C), temperature source Oral, resp. rate 20, height  (1.676 m), weight 53.388 kg (117 lb 11.2 oz), SpO2 99 %.  I/O:  No intake or output data in the 24 hours ending 10/17/15 1416  PHYSICAL EXAMINATION:   Physical Exam  GENERAL:  40 y.o.-year-old patient lying in the bed with no acute distress.  EYES: Pupils equal, round, reactive to light and accommodation. No scleral icterus. Extraocular muscles intact.  HEENT: Head atraumatic, normocephalic. Oropharynx and nasopharynx clear.  NECK:  Supple, no jugular venous distention. No thyroid enlargement, no tenderness.  LUNGS: Normal breath sounds bilaterally, no wheezing, rales,rhonchi or crepitation. No use of accessory muscles of respiration.  CARDIOVASCULAR: S1, S2 normal. No murmurs, rubs, or gallops.  ABDOMEN: Soft, non-tender, non-distended. Bowel sounds present. No organomegaly or mass.  EXTREMITIES: No pedal edema, cyanosis, or clubbing.  NEUROLOGIC: Cranial nerves II through XII are intact. Muscle strength 5/5 in all extremities. Sensation intact. Gait not checked.  PSYCHIATRIC: The patient is alert and oriented x 3.  SKIN: No obvious rash, lesion, or ulcer.   DATA REVIEW:   CBC  Recent Labs Lab 10/15/15 0934  WBC 15.1*  HGB 14.1  HCT 42.2  PLT 284    Chemistries   Recent Labs Lab 10/15/15 0934  NA 133*  K 3.6  CL 106  CO2 20*  GLUCOSE 79  BUN 22*  CREATININE 0.78  CALCIUM 8.5*  MG 2.0    Cardiac Enzymes No results for input(s): TROPONINI in the last 168 hours.  Microbiology Results  Results for orders placed or performed in visit on 06/12/13  Culture, blood (single)      Status: None   Collection Time: 06/12/13 12:30 PM  Result Value Ref Range Status   Micro Text Report   Final       SOURCE: right arm    COMMENT                   NO GROWTH AEROBICALLY/ANAEROBICALLY IN 5 DAYS   ANTIBIOTIC                                                      Culture, blood (single)     Status: None   Collection Time: 06/12/13 12:37 PM  Result Value Ref Range Status   Micro Text Report   Final       SOURCE: left hand    COMMENT                   NO GROWTH AEROBICALLY/ANAEROBICALLY IN 5 DAYS   ANTIBIOTIC  Urine culture     Status: None   Collection Time: 06/12/13 12:37 PM  Result Value Ref Range Status   Micro Text Report   Final       SOURCE: INDWELLING CATHETER    COMMENT                   NO GROWTH IN 36 HOURS   ANTIBIOTIC                                                      CSF culture     Status: None   Collection Time: 06/12/13  3:07 PM  Result Value Ref Range Status   Micro Text Report   Final       COMMENT                   NO GROWTH AEROBICALLY IN 3 DAYS   GRAM STAIN                NO ORGANISMS SEEN NO WBC'S SEEN WHITE BLOOD CELLS   ANTIBIOTIC                                                      Influenza A&B Antigens [facility])     Status: None   Collection Time: 06/12/13  5:18 PM  Result Value Ref Range Status   Micro Text Report   Final       COMMENT                   NEGATIVE FOR INFLUENZA A (ANTIGEN ABSENT)   COMMENT                   NEGATIVE FOR INFLUENZA B (ANTIGEN ABSENT)   ANTIBIOTIC                                                        RADIOLOGY:  No results found.  EKG:   Orders placed or performed during the hospital encounter of 10/14/15  . EKG 12-Lead  . EKG 12-Lead  . EKG 12-Lead  . EKG 12-Lead      Management plans discussed with the patient, family and they are in agreement.  CODE STATUS:  Code Status History    Date Active Date Inactive Code Status  Order ID Comments User Context   10/14/2015  5:47 PM 10/16/2015  2:53 PM Full Code [id number]  Wyatt Hasteavid K Hower, MD ED      TOTAL TIME TAKING CARE OF THIS PATIENT: 37 minutes.    Enid BaasKALISETTI,Salvadore Valvano M.D on 10/17/2015 at 2:16 PM  Between 7am to 6pm - Pager - [phone]  After 6pm go to [email or web address] - password [email or web address] [facility]  [facility]  Office  [phone]  CC: Primary care physician; No PCP Per Patient

## 2017-07-21 ENCOUNTER — Encounter: Payer: Self-pay | Admitting: Emergency Medicine

## 2017-07-21 ENCOUNTER — Emergency Department
Admission: EM | Admit: 2017-07-21 | Discharge: 2017-07-21 | Disposition: A | Discharge status: Home / Self Care | Payer: Self-pay | Attending: Emergency Medicine | Admitting: Emergency Medicine

## 2017-07-21 ENCOUNTER — Other Ambulatory Visit: Payer: Self-pay

## 2017-07-21 ENCOUNTER — Emergency Department: Payer: Self-pay

## 2017-07-21 DIAGNOSIS — Z23 Encounter for immunization: Secondary | ICD-10-CM | POA: Insufficient documentation

## 2017-07-21 DIAGNOSIS — Y999 Unspecified external cause status: Secondary | ICD-10-CM | POA: Insufficient documentation

## 2017-07-21 DIAGNOSIS — S61451A Open bite of right hand, initial encounter: Secondary | ICD-10-CM | POA: Insufficient documentation

## 2017-07-21 DIAGNOSIS — Y92009 Unspecified place in unspecified non-institutional (private) residence as the place of occurrence of the external cause: Secondary | ICD-10-CM | POA: Insufficient documentation

## 2017-07-21 DIAGNOSIS — W540XXA Bitten by dog, initial encounter: Secondary | ICD-10-CM | POA: Insufficient documentation

## 2017-07-21 DIAGNOSIS — Y9389 Activity, other specified: Secondary | ICD-10-CM | POA: Insufficient documentation

## 2017-07-21 MED ORDER — BACITRACIN ZINC 500 UNIT/GM EX OINT
1.0000 "application " | TOPICAL_OINTMENT | Freq: Once | CUTANEOUS | Status: AC
  Administered 2017-07-21: 1 via TOPICAL
  Filled 2017-07-21: qty 0.9

## 2017-07-21 MED ORDER — TETANUS-DIPHTH-ACELL PERTUSSIS 5-2.5-18.5 LF-MCG/0.5 IM SUSP
0.5000 mL | Freq: Once | INTRAMUSCULAR | Status: AC
  Administered 2017-07-21: 0.5 mL via INTRAMUSCULAR
  Filled 2017-07-21: qty 0.5

## 2017-07-21 MED ORDER — HYDROCODONE-ACETAMINOPHEN 5-325 MG PO TABS: 1.0000 | ORAL_TABLET | ORAL | 0 refills | Status: AC | PRN

## 2017-07-21 MED ORDER — AMOXICILLIN-POT CLAVULANATE 875-125 MG PO TABS
1.0000 | ORAL_TABLET | Freq: Once | ORAL | Status: AC
  Administered 2017-07-21: 1 via ORAL
  Filled 2017-07-21: qty 1

## 2017-07-21 MED ORDER — IBUPROFEN 600 MG PO TABS: 600.0000 mg | ORAL_TABLET | Freq: Four times a day (QID) | ORAL | 0 refills | Status: AC | PRN

## 2017-07-21 MED ORDER — AMOXICILLIN-POT CLAVULANATE 875-125 MG PO TABS: 1.0000 | ORAL_TABLET | Freq: Two times a day (BID) | ORAL | 0 refills | Status: AC

## 2017-07-21 NOTE — ED Triage Notes (Signed)
Dog bite L hand. Her own dog bit her while he was having a seizure. He has been vaccinated.

## 2017-07-21 NOTE — ED Notes (Signed)
Right hand soaking in betadine saline mixture.

## 2017-07-21 NOTE — ED Provider Notes (Signed)
[facility] Emergency Department Provider Note  ____________________________________________  Time seen: Approximately 10:24 AM  I have reviewed the triage vital signs and the nursing notes.   HISTORY  Chief Complaint Animal Bite   HPI Valerie Castillo is a 41 y.o. female who presents to the emergency department for evaluation after her dog bit her right hand while having a seizure. She states she pulled her hand back as he was biting down. T-dap is not up to date.Incident occurred at about 1:00 am. She cleaned the wound afterward and has applied antibiotic ointment.  Past Medical History:  Diagnosis Date  . Seizures [facility](HCC)     Patient Active Problem List   Diagnosis Date Noted  . Seizure (HCC) 10/14/2015    History reviewed. No pertinent surgical history.  Prior to Admission medications   Medication Sig Start Date End Date Taking? Authorizing Provider  amoxicillin-clavulanate (AUGMENTIN) 875-125 MG tablet Take 1 tablet by mouth 2 (two) times daily. 07/21/17   Zeda Gangwer, Rulon Eisenmengerari B, FNP  guaiFENesin (MUCINEX) 600 MG 12 hr tablet Take 1 tablet (600 mg total) by mouth 2 (two) times daily. 10/16/15   Enid BaasKalisetti, Radhika, MD  HYDROcodone-acetaminophen (NORCO/VICODIN) 5-325 MG tablet Take 1 tablet by mouth every 4 (four) hours as needed for moderate pain. 07/21/17 07/21/18  Annya Lizana, Kasandra Knudsenari B, FNP  ibuprofen (ADVIL,MOTRIN) 600 MG tablet Take 1 tablet (600 mg total) by mouth every 6 (six) hours as needed. 07/21/17   Cheyna Retana, Rulon Eisenmengerari B, FNP  levETIRAcetam (KEPPRA) 1000 MG tablet Take 1 tablet (1,000 mg total) by mouth 2 (two) times daily. 10/16/15   Enid BaasKalisetti, Radhika, MD  loratadine (CLARITIN) 10 MG tablet Take 10 mg by mouth daily.    [provider]  naproxen sodium (ANAPROX) 220 MG tablet Take 220 mg by mouth 2 (two) times daily as needed (for headache).    [provider]  traZODone (DESYREL) 100 MG tablet Take 1 tablet by mouth at bedtime. 09/07/15 12/06/15   [provider]    Allergies Patient has no known allergies.  Family History  Problem Relation Age of Onset  . Diabetes Neg Hx     Social History Social History   Tobacco Use  . Smoking status: Current Every Day Smoker  Substance Use Topics  . Alcohol use: No  . Drug use: Yes    Types: Cocaine    Review of Systems  Constitutional: Negative for fever. Respiratory: Negative for cough or shortness of breath.  Musculoskeletal: Positive for myalgias Skin: Positive for puncture wounds and laceration to the right hand. Neurological: Negative for numbness or paresthesias. ____________________________________________   PHYSICAL EXAM:  VITAL SIGNS: ED Triage Vitals  Enc Vitals Group     BP 07/21/17 1003 138/85     Pulse Rate 07/21/17 1003 75     Resp 07/21/17 1003 18     Temp 07/21/17 1003 98.4 F (36.9 C)     Temp Source 07/21/17 1003 Oral     SpO2 07/21/17 1003 97 %     Weight 07/21/17 1005 125 lb (56.7 kg)     Height 07/21/17 1005 5\' 7"  (1.702 m)     Head Circumference --      Peak Flow --      Pain Score 07/21/17 1003 8     Pain Loc --      Pain Edu? --      Excl. in GC? --      Constitutional: Uncomfortable appearing. Eyes: Conjunctivae are clear without discharge or  drainage. Nose: No rhinorrhea noted. Mouth/Throat: Airway is patent.  Neck: No stridor. Unrestricted range of motion observed.  Cardiovascular: Capillary refill is <3 seconds.  Respiratory: Respirations are even and unlabored.. Musculoskeletal: Unrestricted range of motion observed. Neurologic: Awake, alert, and oriented x 4.  Skin: Diffuse, generalized swelling of the right hand with erythema. 2 cm superficial laceration present to the dorsal aspect of the right hand over the MCP of the thumb. Puncture wound noted over the dorsal aspect of the right hand at the index finger. 1cm laceration on the dorsal aspect, proximal hand. No active  bleeding.  ____________________________________________   LABS (all labs ordered are listed, but only abnormal results are displayed)  Labs Reviewed - No data to display ____________________________________________  EKG  Not indicated. ____________________________________________  RADIOLOGY  No acute bony abnormality per radiology. I, Kem Boroughsari Montanna Mcbain, personally viewed and evaluated these images (plain radiographs) as part of my medical decision making, as well as reviewing the written report by the radiologist. ___________________________________________   PROCEDURES  Procedures ____________________________________________   INITIAL IMPRESSION / ASSESSMENT AND PLAN / ED COURSE  Valerie Castillo is a 41 y.o. female who presents to the emergency department for evaluation after a dog bite to her dominant hand. No evidence of retained foreign body related to the dog bite.  While in the emergency department today, her hand was soaked in normal saline and Betadine solution and then a bacitracin and sterile bandage was applied over the laceration and puncture wound to the right hand.  Patient will be discharged home with a prescription for Augmentin, Norco, and ibuprofen.  She was given wound care instructions and reasons to return to the emergency department or seek follow-up care were discussed.  Patient verbalized understanding.  Medications  bacitracin ointment 1 application (not administered)  amoxicillin-clavulanate (AUGMENTIN) 875-125 MG per tablet 1 tablet (not administered)  Tdap (BOOSTRIX) injection 0.5 mL (0.5 mLs Intramuscular Given 07/21/17 1054)     Pertinent labs & imaging results that were available during my care of the patient were reviewed by me and considered in my medical decision making (see chart for details). ____________________________________________   FINAL CLINICAL IMPRESSION(S) / ED DIAGNOSES  Final diagnoses:  Dog bite of right hand, initial encounter     ED Discharge Orders        Ordered    amoxicillin-clavulanate (AUGMENTIN) 875-125 MG tablet  2 times daily     07/21/17 1116    HYDROcodone-acetaminophen (NORCO/VICODIN) 5-325 MG tablet  Every 4 hours PRN     07/21/17 1116    ibuprofen (ADVIL,MOTRIN) 600 MG tablet  Every 6 hours PRN     07/21/17 1116       Note:  This document was prepared using Dragon voice recognition software and may include unintentional dictation errors.    Chinita Pesterriplett, Mareesa Gathright B, FNP 07/21/17 1138    Arnaldo NatalMalinda, Paul F, MD 07/21/17 [phone]

## 2017-07-21 NOTE — Discharge Instructions (Signed)
Please make sure to take the antibiotic until finished.   If you are concerned that there is infection, either see your primary care provider, Dr. Stephenie AcresSoria, or return to the ER.
